# Patient Record
Sex: Male | Born: 1950 | Race: White | Hispanic: No | Marital: Married | Smoking: Never smoker
Health system: Southern US, Community
[De-identification: ages and names within clinical notes are randomized; demographics above are authoritative.]

## PROBLEM LIST (undated history)

## (undated) DIAGNOSIS — I10 Essential (primary) hypertension: Secondary | ICD-10-CM

## (undated) DIAGNOSIS — T4145XA Adverse effect of unspecified anesthetic, initial encounter: Secondary | ICD-10-CM

## (undated) DIAGNOSIS — K219 Gastro-esophageal reflux disease without esophagitis: Secondary | ICD-10-CM

## (undated) DIAGNOSIS — I341 Nonrheumatic mitral (valve) prolapse: Secondary | ICD-10-CM

## (undated) DIAGNOSIS — N189 Chronic kidney disease, unspecified: Secondary | ICD-10-CM

## (undated) DIAGNOSIS — T8859XA Other complications of anesthesia, initial encounter: Secondary | ICD-10-CM

## (undated) DIAGNOSIS — Z9889 Other specified postprocedural states: Secondary | ICD-10-CM

## (undated) DIAGNOSIS — M1990 Unspecified osteoarthritis, unspecified site: Secondary | ICD-10-CM

## (undated) DIAGNOSIS — Z87442 Personal history of urinary calculi: Secondary | ICD-10-CM

## (undated) DIAGNOSIS — R112 Nausea with vomiting, unspecified: Secondary | ICD-10-CM

## (undated) HISTORY — PX: MANDIBLE FRACTURE SURGERY: SHX706

## (undated) HISTORY — PX: OTHER SURGICAL HISTORY: SHX169

## (undated) HISTORY — PX: APPENDECTOMY: SHX54

---

## 2007-04-06 ENCOUNTER — Emergency Department (HOSPITAL_COMMUNITY): Admission: EM | Admit: 2007-04-06 | Discharge: 2007-04-06 | Payer: Self-pay | Admitting: Emergency Medicine

## 2007-06-23 ENCOUNTER — Emergency Department (HOSPITAL_COMMUNITY): Admission: EM | Admit: 2007-06-23 | Discharge: 2007-06-23 | Payer: Self-pay | Admitting: Family Medicine

## 2007-11-11 ENCOUNTER — Encounter (INDEPENDENT_AMBULATORY_CARE_PROVIDER_SITE_OTHER): Payer: Self-pay | Admitting: Gastroenterology

## 2007-11-11 ENCOUNTER — Ambulatory Visit (HOSPITAL_COMMUNITY): Admission: RE | Admit: 2007-11-11 | Discharge: 2007-11-11 | Payer: Self-pay | Admitting: Gastroenterology

## 2015-06-03 HISTORY — PX: CARPAL TUNNEL RELEASE: SHX101

## 2017-12-15 ENCOUNTER — Other Ambulatory Visit (HOSPITAL_COMMUNITY): Payer: Self-pay | Admitting: Neurological Surgery

## 2017-12-15 ENCOUNTER — Other Ambulatory Visit: Payer: Self-pay | Admitting: Neurological Surgery

## 2017-12-15 DIAGNOSIS — M48061 Spinal stenosis, lumbar region without neurogenic claudication: Secondary | ICD-10-CM

## 2017-12-29 NOTE — Progress Notes (Addendum)
PCP: Alyce Paganenise Bryant PA-C  Cardiologist: pt denies  EKG: pt denies past year, obtained today  Stress test:  2016 requested from Dr. Leverne Humblesomas Vybiral, MD  ECHO: 2016 requested from Dr. Leverne Humblesomas Vybiral, MD  Cardiac Cath: pt denies  Chest x-ray: pt denies past year

## 2017-12-29 NOTE — Pre-Procedure Instructions (Signed)
Brian Davenport  12/29/2017      [id number] - [place], [place] - [address] AT [facility] [address] [place] [address] Phone: [phone] Fax: [phone]    Your procedure is scheduled on January 05, 2018.  Report to [facility] Admitting at 945 AM.  Call this number if you have problems the morning of surgery:  [phone]   Remember:  Do not eat or drink after midnight.     Take these medicines the morning of surgery with A SIP OF WATER (none).  7 days prior to surgery STOP taking any Aspirin (unless otherwise instructed by your surgeon), Aleve, Naproxen, Ibuprofen, Motrin, Advil, Goody's, BC's, all herbal medications, fish oil, and all vitamins    Do not wear jewelry, make-up or nail polish.  Do not wear lotions, powders, or perfumes, or deodorant.  Men may shave face and neck.  Do not bring valuables to the hospital.  [facility] is not responsible for any belongings or valuables.  Contacts, dentures or bridgework may not be worn into surgery.  Leave your suitcase in the car.  After surgery it may be brought to your room.  For patients admitted to the hospital, discharge time will be determined by your treatment team.  Patients discharged the day of surgery will not be allowed to drive home.    [place]- Preparing For Surgery  Before surgery, you can play an important role. Because skin is not sterile, your skin needs to be as free of germs as possible. You can reduce the number of germs on your skin by washing with CHG (chlorahexidine gluconate) Soap before surgery.  CHG is an antiseptic cleaner which kills germs and bonds with the skin to continue killing germs even after washing.    Oral Hygiene is also important to reduce your risk of infection.  Remember - BRUSH YOUR TEETH THE MORNING OF SURGERY WITH YOUR REGULAR TOOTHPASTE  Please do not use if you have an allergy to CHG or antibacterial soaps. If your skin becomes  reddened/irritated stop using the CHG.  Do not shave (including legs and underarms) for at least 48 hours prior to first CHG shower. It is OK to shave your face.  Please follow these instructions carefully.   1. Shower the NIGHT BEFORE SURGERY and the MORNING OF SURGERY with CHG.   2. If you chose to wash your hair, wash your hair first as usual with your normal shampoo.  3. After you shampoo, rinse your hair and body thoroughly to remove the shampoo.  4. Use CHG as you would any other liquid soap. You can apply CHG directly to the skin and wash gently with a scrungie or a clean washcloth.   5. Apply the CHG Soap to your body ONLY FROM THE NECK DOWN.  Do not use on open wounds or open sores. Avoid contact with your eyes, ears, mouth and genitals (private parts). Wash Face and genitals (private parts)  with your normal soap.  6. Wash thoroughly, paying special attention to the area where your surgery will be performed.  7. Thoroughly rinse your body with warm water from the neck down.  8. DO NOT shower/wash with your normal soap after using and rinsing off the CHG Soap.  9. Pat yourself dry with a CLEAN TOWEL.  10. Wear CLEAN PAJAMAS to bed the night before surgery, wear comfortable clothes the morning of surgery  11. Place CLEAN  SHEETS on your bed the night of your first shower and DO NOT SLEEP WITH PETS.  Day of Surgery:  Do not apply any deodorants/lotions.  Please wear clean clothes to the hospital/surgery center.   Remember to brush your teeth WITH YOUR REGULAR TOOTHPASTE.  Please read over the following fact sheets that you were given. Pain Booklet, Coughing and Deep Breathing, MRSA Information and Surgical Site Infection Prevention

## 2017-12-30 ENCOUNTER — Encounter (HOSPITAL_COMMUNITY)
Admission: RE | Admit: 2017-12-30 | Discharge: 2017-12-30 | Disposition: A | Discharge status: Home / Self Care | Payer: Medicare PPO | Source: Ambulatory Visit | Attending: Neurological Surgery | Admitting: Neurological Surgery

## 2017-12-30 ENCOUNTER — Ambulatory Visit (HOSPITAL_COMMUNITY)
Admission: RE | Admit: 2017-12-30 | Discharge: 2017-12-30 | Disposition: A | Discharge status: Home / Self Care | Payer: Medicare PPO | Source: Ambulatory Visit | Attending: Neurological Surgery | Admitting: Neurological Surgery

## 2017-12-30 ENCOUNTER — Encounter (HOSPITAL_COMMUNITY): Payer: Self-pay

## 2017-12-30 ENCOUNTER — Other Ambulatory Visit: Payer: Self-pay

## 2017-12-30 DIAGNOSIS — M48061 Spinal stenosis, lumbar region without neurogenic claudication: Secondary | ICD-10-CM | POA: Diagnosis not present

## 2017-12-30 DIAGNOSIS — Z01810 Encounter for preprocedural cardiovascular examination: Secondary | ICD-10-CM | POA: Insufficient documentation

## 2017-12-30 DIAGNOSIS — M4316 Spondylolisthesis, lumbar region: Secondary | ICD-10-CM | POA: Diagnosis not present

## 2017-12-30 HISTORY — DX: Gastro-esophageal reflux disease without esophagitis: K21.9

## 2017-12-30 HISTORY — DX: Essential (primary) hypertension: I10

## 2017-12-30 HISTORY — DX: Nausea with vomiting, unspecified: R11.2

## 2017-12-30 HISTORY — DX: Nonrheumatic mitral (valve) prolapse: I34.1

## 2017-12-30 HISTORY — DX: Personal history of urinary calculi: Z87.442

## 2017-12-30 HISTORY — DX: Adverse effect of unspecified anesthetic, initial encounter: T41.45XA

## 2017-12-30 HISTORY — DX: Unspecified osteoarthritis, unspecified site: M19.90

## 2017-12-30 HISTORY — DX: Other specified postprocedural states: Z98.890

## 2017-12-30 HISTORY — DX: Other complications of anesthesia, initial encounter: T88.59XA

## 2017-12-30 HISTORY — DX: Chronic kidney disease, unspecified: N18.9

## 2017-12-30 LAB — CBC
HCT: 44.4 % (ref 39.0–52.0)
HEMOGLOBIN: 14.8 g/dL (ref 13.0–17.0)
MCH: 32.9 pg (ref 26.0–34.0)
MCHC: 33.3 g/dL (ref 30.0–36.0)
MCV: 98.7 fL (ref 78.0–100.0)
PLATELETS: 147 10*3/uL — AB (ref 150–400)
RBC: 4.5 MIL/uL (ref 4.22–5.81)
RDW: 12.8 % (ref 11.5–15.5)
WBC: 6.9 10*3/uL (ref 4.0–10.5)

## 2017-12-30 LAB — SURGICAL PCR SCREEN
MRSA, PCR: NEGATIVE
STAPHYLOCOCCUS AUREUS: NEGATIVE

## 2017-12-30 LAB — TYPE AND SCREEN
ABO/RH(D): A POS
ANTIBODY SCREEN: NEGATIVE

## 2017-12-30 LAB — ABO/RH: ABO/RH(D): A POS

## 2017-12-30 LAB — BASIC METABOLIC PANEL
Anion gap: 9 (ref 5–15)
BUN: 32 mg/dL — ABNORMAL HIGH (ref 8–23)
CALCIUM: 9.6 mg/dL (ref 8.9–10.3)
CHLORIDE: 105 mmol/L (ref 98–111)
CO2: 25 mmol/L (ref 22–32)
CREATININE: 1.94 mg/dL — AB (ref 0.61–1.24)
GFR calc non Af Amer: 34 mL/min — ABNORMAL LOW (ref >60)
GFR, EST AFRICAN AMERICAN: 39 mL/min — AB (ref >60)
Glucose, Bld: 99 mg/dL (ref 70–99)
Potassium: 4.5 mmol/L (ref 3.5–5.1)
SODIUM: 139 mmol/L (ref 135–145)

## 2017-12-31 NOTE — Progress Notes (Signed)
Anesthesia Chart Review:  Case:  [id number] Date/Time:  01/05/18 1132   Procedures:      Lumbar 1-2, Lumbar 2-3, Lumbar 3-4, Lumbar 4-5 Anterolateral decompression/fusion/posterior percutaneous arthrodesis/infuse/mazor (N/A ) - Lumbar 1-2, Lumbar 2-3, Lumbar 3-4, Lumbar 4-5 Anterolateral decompression/fusion/posterior percutaneous arthrodesis/infuse/mazor     APPLICATION OF ROBOTIC ASSISTANCE FOR SPINAL PROCEDURE (N/A )   Anesthesia type:  General   Pre-op diagnosis:  Degenerative Lumbar Spinal stenosis   Location:  [facility] OR ROOM 21 / [facility] OR   Surgeon:  Barnett AbuElsner, Henry, MD      DISCUSSION: 67 yo male never smoker for above procedure. 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  Pt is followed by Nephrology Dr. Merrilee Jans[facility]Gregory Greenwood [facility] for ADPKD with stable elevated creatinine. Review of labs in Care Everywhere shows creatinine range 1.6-1.9 over the past 2 years.  He was previously [facility] by at [facility] by Dr. Leverne Humblesomas Vybiral for evaluation of chronic DOE, fatigue, weakness. He had an Echo with normal LV size and function, EF 71%. He had a treadmill stress test with no ischemic abnormalities.  Anticipate he can proceed with surgery as scheduled barring acute status change.  VS: BP (!) 147/66   Pulse 73   Temp 36.5 C   Resp 20   Ht 5\' 11"  (1.803 m)   Wt 167 lb 12.8 oz (76.1 kg)   SpO2 96%   BMI 23.40 kg/m   PROVIDERS: Carolynn ServeBryant, Denise A, MD is PCP  Frazier ButtVybiral, Thomas, MD is Cardiologist  LABS: Labs reviewed: Acceptable for surgery. Elevated Cr in setting of ADPKD, consistent with previous values. (all labs ordered are listed, but only abnormal results are displayed)  Labs Reviewed  BASIC METABOLIC PANEL - Abnormal; Notable for the following components:      Result Value   BUN 32 (*)    Creatinine, Ser 1.94 (*)    GFR calc non Af Amer 34 (*)    GFR calc Af Amer 39 (*)    All other components within normal limits  CBC - Abnormal; Notable  for the following components:   Platelets 147 (*)    All other components within normal limits  SURGICAL PCR SCREEN  TYPE AND SCREEN  ABO/RH     IMAGES: N/A   EKG: 12/30/2017: Sinus bradycardia (58)  CV: Exercise stress test August 02, 2015 (outside records see copy in patient chart): Mr. Eugenia Pancoastrimble underwent regular Bruce protocol cardiac stress test.  The patient was able to exercise for 3 minutes, achieving 4.6 METS.  The test was accurate.  The patient was transferred.  EKG showed no ischemic ST segment abnormalities.  It did show a short episode of paroxysmal supraventricular tachycardia.  I will call the test subjectively and objectively negative.  ECHO September 13, 2015 (outside record, see copy in patient chart): The left ventricle is normal in size and function. The left ventricular ejection fraction is estimated at 71%. Left ventricular function is in the upper limit of normal. There is mild concentric left ventricular hypertrophy. The sinus of Valsalva, aortic root and ascending aorta are not dilated. The right ventricle is normal in size and function. Left atrium is normal in size. The right atrium is normal in size. Mitral leaflets are thickened. Mitral valve prolapse is present. Aortic valvular sclerosis is present. The tricuspid valve is pliable. The pulmonic valve is not visualized. There is no pericardial effusion. There are no valvular vegetations or intracavitary thrombi. Summary: Normal left ventricular  size and systolic function. Left ventricular hypertrophy and diastolic dysfunction. Mild mitral valve prolapse mild mitral regurgitation.  Past Medical History:  Diagnosis Date  . Arthritis   . Chronic kidney disease    polycystic kidney disease  . Complication of anesthesia   . GERD (gastroesophageal reflux disease)   . History of kidney stones   . Hypertension   . Mitral valve prolapse   . PONV (postoperative nausea and vomiting)     Past Surgical  History:  Procedure Laterality Date  . APPENDECTOMY    . CARPAL TUNNEL RELEASE Bilateral 2017  . MANDIBLE FRACTURE SURGERY    . sinus sugery      MEDICATIONS: . Ascorbic Acid (VITAMIN C) 1000 MG tablet  . Cholecalciferol (VITAMIN D) 2000 units tablet  . Magnesium Oxide (MAG-200) 200 MG TABS   No current facility-administered medications for this encounter.     Zannie Cove [facility] Short Stay Center/Anesthesiology Phone [phone] 12/31/2017 4:53 PM

## 2018-01-05 ENCOUNTER — Other Ambulatory Visit: Payer: Self-pay

## 2018-01-05 ENCOUNTER — Encounter (HOSPITAL_COMMUNITY)
Admission: RE | Disposition: A | Discharge status: Home / Self Care | Payer: Self-pay | Source: Home / Self Care | Attending: Neurological Surgery

## 2018-01-05 ENCOUNTER — Inpatient Hospital Stay (HOSPITAL_COMMUNITY): Payer: Medicare PPO | Admitting: Anesthesiology

## 2018-01-05 ENCOUNTER — Inpatient Hospital Stay (HOSPITAL_COMMUNITY): Payer: Medicare PPO | Admitting: Physician Assistant

## 2018-01-05 ENCOUNTER — Encounter (HOSPITAL_COMMUNITY): Payer: Self-pay | Admitting: *Deleted

## 2018-01-05 ENCOUNTER — Inpatient Hospital Stay (HOSPITAL_COMMUNITY)
Admission: RE | Admit: 2018-01-05 | Discharge: 2018-01-09 | DRG: 457 | Disposition: A | Discharge status: Home / Self Care | Payer: Medicare PPO | Attending: Neurological Surgery | Admitting: Neurological Surgery

## 2018-01-05 ENCOUNTER — Inpatient Hospital Stay (HOSPITAL_COMMUNITY): Payer: Medicare PPO

## 2018-01-05 DIAGNOSIS — K5900 Constipation, unspecified: Secondary | ICD-10-CM | POA: Diagnosis not present

## 2018-01-05 DIAGNOSIS — M419 Scoliosis, unspecified: Secondary | ICD-10-CM | POA: Diagnosis present

## 2018-01-05 DIAGNOSIS — M48062 Spinal stenosis, lumbar region with neurogenic claudication: Principal | ICD-10-CM | POA: Diagnosis present

## 2018-01-05 DIAGNOSIS — M4150 Other secondary scoliosis, site unspecified: Secondary | ICD-10-CM | POA: Diagnosis present

## 2018-01-05 DIAGNOSIS — Z419 Encounter for procedure for purposes other than remedying health state, unspecified: Secondary | ICD-10-CM

## 2018-01-05 DIAGNOSIS — M5416 Radiculopathy, lumbar region: Secondary | ICD-10-CM | POA: Diagnosis present

## 2018-01-05 DIAGNOSIS — M5126 Other intervertebral disc displacement, lumbar region: Secondary | ICD-10-CM | POA: Diagnosis present

## 2018-01-05 DIAGNOSIS — Q613 Polycystic kidney, unspecified: Secondary | ICD-10-CM | POA: Diagnosis not present

## 2018-01-05 DIAGNOSIS — Z885 Allergy status to narcotic agent status: Secondary | ICD-10-CM | POA: Diagnosis not present

## 2018-01-05 DIAGNOSIS — M549 Dorsalgia, unspecified: Secondary | ICD-10-CM | POA: Diagnosis present

## 2018-01-05 HISTORY — PX: ANTERIOR LATERAL LUMBAR FUSION 4 LEVELS: SHX5552

## 2018-01-05 HISTORY — PX: APPLICATION OF ROBOTIC ASSISTANCE FOR SPINAL PROCEDURE: SHX6753

## 2018-01-05 HISTORY — PX: LUMBAR PERCUTANEOUS PEDICLE SCREW 3 LEVEL: SHX5562

## 2018-01-05 SURGERY — ANTERIOR LATERAL LUMBAR FUSION 4 LEVELS
Anesthesia: General | Site: Spine Lumbar

## 2018-01-05 MED ORDER — POLYETHYLENE GLYCOL 3350 17 G PO PACK
17.0000 g | PACK | Freq: Every day | ORAL | Status: DC | PRN
  Administered 2018-01-06 – 2018-01-08 (×2): 17 g via ORAL
  Filled 2018-01-05 (×2): qty 1

## 2018-01-05 MED ORDER — CHLORHEXIDINE GLUCONATE CLOTH 2 % EX PADS: 6.0000 | MEDICATED_PAD | Freq: Once | CUTANEOUS | Status: DC

## 2018-01-05 MED ORDER — DEXAMETHASONE SODIUM PHOSPHATE 10 MG/ML IJ SOLN
INTRAMUSCULAR | Status: AC
  Filled 2018-01-05: qty 1

## 2018-01-05 MED ORDER — SCOPOLAMINE 1 MG/3DAYS TD PT72
MEDICATED_PATCH | TRANSDERMAL | Status: DC | PRN
  Administered 2018-01-05: 1 via TRANSDERMAL

## 2018-01-05 MED ORDER — LIDOCAINE HCL (CARDIAC) PF 100 MG/5ML IV SOSY
PREFILLED_SYRINGE | INTRAVENOUS | Status: DC | PRN
  Administered 2018-01-05: 100 mg via INTRATRACHEAL

## 2018-01-05 MED ORDER — HYDROMORPHONE HCL 1 MG/ML IJ SOLN
0.2500 mg | INTRAMUSCULAR | Status: DC | PRN
  Administered 2018-01-05 (×2): 1 mg via INTRAVENOUS

## 2018-01-05 MED ORDER — MIDAZOLAM HCL 2 MG/2ML IJ SOLN
INTRAMUSCULAR | Status: AC
Start: 2018-01-05 — End: ?
  Filled 2018-01-05: qty 2

## 2018-01-05 MED ORDER — CEFAZOLIN SODIUM-DEXTROSE 2-4 GM/100ML-% IV SOLN
INTRAVENOUS | Status: AC
  Filled 2018-01-05: qty 100

## 2018-01-05 MED ORDER — HYDROMORPHONE HCL 1 MG/ML IJ SOLN
INTRAMUSCULAR | Status: AC
  Filled 2018-01-05: qty 1

## 2018-01-05 MED ORDER — LACTATED RINGERS IV SOLN
INTRAVENOUS | Status: DC | PRN
  Administered 2018-01-05 (×3): via INTRAVENOUS

## 2018-01-05 MED ORDER — METHOCARBAMOL 1000 MG/10ML IJ SOLN
500.0000 mg | Freq: Four times a day (QID) | INTRAVENOUS | Status: DC | PRN
  Administered 2018-01-05: 500 mg via INTRAVENOUS
  Filled 2018-01-05 (×2): qty 5

## 2018-01-05 MED ORDER — ROCURONIUM BROMIDE 10 MG/ML (PF) SYRINGE
PREFILLED_SYRINGE | INTRAVENOUS | Status: AC
  Filled 2018-01-05: qty 10

## 2018-01-05 MED ORDER — MIDAZOLAM HCL 2 MG/2ML IJ SOLN
INTRAMUSCULAR | Status: AC
  Filled 2018-01-05: qty 2

## 2018-01-05 MED ORDER — LIDOCAINE 2% (20 MG/ML) 5 ML SYRINGE
INTRAMUSCULAR | Status: AC
  Filled 2018-01-05: qty 5

## 2018-01-05 MED ORDER — DIPHENHYDRAMINE HCL 50 MG/ML IJ SOLN
12.5000 mg | Freq: Four times a day (QID) | INTRAMUSCULAR | Status: DC | PRN
  Administered 2018-01-06: 12.5 mg via INTRAVENOUS
  Filled 2018-01-05: qty 1

## 2018-01-05 MED ORDER — SUCCINYLCHOLINE CHLORIDE 200 MG/10ML IV SOSY
PREFILLED_SYRINGE | INTRAVENOUS | Status: AC
  Filled 2018-01-05: qty 10

## 2018-01-05 MED ORDER — NALOXONE HCL 0.4 MG/ML IJ SOLN: 0.4000 mg | INTRAMUSCULAR | Status: DC | PRN

## 2018-01-05 MED ORDER — SCOPOLAMINE 1 MG/3DAYS TD PT72
MEDICATED_PATCH | TRANSDERMAL | Status: AC
  Filled 2018-01-05: qty 1

## 2018-01-05 MED ORDER — MIDAZOLAM HCL 5 MG/5ML IJ SOLN
INTRAMUSCULAR | Status: DC | PRN
  Administered 2018-01-05 (×2): 2 mg via INTRAVENOUS

## 2018-01-05 MED ORDER — FENTANYL CITRATE (PF) 250 MCG/5ML IJ SOLN
INTRAMUSCULAR | Status: AC
  Filled 2018-01-05: qty 10

## 2018-01-05 MED ORDER — SODIUM CHLORIDE 0.9 % IV SOLN
INTRAVENOUS | Status: DC | PRN
  Administered 2018-01-05: 14:00:00

## 2018-01-05 MED ORDER — HYDROMORPHONE HCL 1 MG/ML IJ SOLN
INTRAMUSCULAR | Status: AC
  Administered 2018-01-05: 1 mg
  Filled 2018-01-05: qty 1

## 2018-01-05 MED ORDER — DIPHENHYDRAMINE HCL 12.5 MG/5ML PO ELIX: 12.5000 mg | ORAL_SOLUTION | Freq: Four times a day (QID) | ORAL | Status: DC | PRN

## 2018-01-05 MED ORDER — BISACODYL 10 MG RE SUPP: 10.0000 mg | Freq: Every day | RECTAL | Status: DC | PRN

## 2018-01-05 MED ORDER — OXYCODONE-ACETAMINOPHEN 5-325 MG PO TABS: 1.0000 | ORAL_TABLET | ORAL | Status: DC | PRN

## 2018-01-05 MED ORDER — PHENYLEPHRINE 40 MCG/ML (10ML) SYRINGE FOR IV PUSH (FOR BLOOD PRESSURE SUPPORT)
PREFILLED_SYRINGE | INTRAVENOUS | Status: AC
  Filled 2018-01-05: qty 10

## 2018-01-05 MED ORDER — MENTHOL 3 MG MT LOZG: 1.0000 | LOZENGE | OROMUCOSAL | Status: DC | PRN

## 2018-01-05 MED ORDER — LIDOCAINE-EPINEPHRINE 1 %-1:100000 IJ SOLN
INTRAMUSCULAR | Status: AC
  Filled 2018-01-05: qty 1

## 2018-01-05 MED ORDER — PROMETHAZINE HCL 25 MG/ML IJ SOLN
6.2500 mg | INTRAMUSCULAR | Status: DC | PRN
  Administered 2018-01-05: 12.5 mg via INTRAVENOUS

## 2018-01-05 MED ORDER — ONDANSETRON HCL 4 MG/2ML IJ SOLN
4.0000 mg | Freq: Four times a day (QID) | INTRAMUSCULAR | Status: DC | PRN
  Administered 2018-01-05 – 2018-01-06 (×2): 4 mg via INTRAVENOUS
  Filled 2018-01-05 (×2): qty 2

## 2018-01-05 MED ORDER — FENTANYL CITRATE (PF) 250 MCG/5ML IJ SOLN
INTRAMUSCULAR | Status: DC | PRN
  Administered 2018-01-05 (×2): 50 ug via INTRAVENOUS
  Administered 2018-01-05: 150 ug via INTRAVENOUS
  Administered 2018-01-05: 100 ug via INTRAVENOUS

## 2018-01-05 MED ORDER — PHENYLEPHRINE HCL 10 MG/ML IJ SOLN
INTRAMUSCULAR | Status: DC | PRN
  Administered 2018-01-05: 20 ug/min via INTRAVENOUS

## 2018-01-05 MED ORDER — ONDANSETRON HCL 4 MG/2ML IJ SOLN: 4.0000 mg | Freq: Four times a day (QID) | INTRAMUSCULAR | Status: DC | PRN

## 2018-01-05 MED ORDER — GLYCOPYRROLATE 0.2 MG/ML IJ SOLN
INTRAMUSCULAR | Status: DC | PRN
  Administered 2018-01-05: 0.2 mg via INTRAVENOUS

## 2018-01-05 MED ORDER — LACTATED RINGERS IV SOLN
INTRAVENOUS | Status: DC
  Administered 2018-01-05 – 2018-01-07 (×5): via INTRAVENOUS

## 2018-01-05 MED ORDER — DEXAMETHASONE SODIUM PHOSPHATE 10 MG/ML IJ SOLN
INTRAMUSCULAR | Status: DC | PRN
  Administered 2018-01-05: 10 mg via INTRAVENOUS

## 2018-01-05 MED ORDER — 0.9 % SODIUM CHLORIDE (POUR BTL) OPTIME
TOPICAL | Status: DC | PRN
  Administered 2018-01-05 (×2): 1000 mL

## 2018-01-05 MED ORDER — SENNA 8.6 MG PO TABS
1.0000 | ORAL_TABLET | Freq: Two times a day (BID) | ORAL | Status: DC
  Administered 2018-01-06 – 2018-01-09 (×7): 8.6 mg via ORAL
  Filled 2018-01-05 (×8): qty 1

## 2018-01-05 MED ORDER — SODIUM CHLORIDE 0.9 % IV SOLN: 250.0000 mL | INTRAVENOUS | Status: DC

## 2018-01-05 MED ORDER — SODIUM CHLORIDE 0.9% FLUSH: 3.0000 mL | INTRAVENOUS | Status: DC | PRN

## 2018-01-05 MED ORDER — ONDANSETRON HCL 4 MG/2ML IJ SOLN
INTRAMUSCULAR | Status: DC | PRN
  Administered 2018-01-05 (×2): 4 mg via INTRAVENOUS

## 2018-01-05 MED ORDER — PHENOL 1.4 % MT LIQD: 1.0000 | OROMUCOSAL | Status: DC | PRN

## 2018-01-05 MED ORDER — GLYCOPYRROLATE PF 0.2 MG/ML IJ SOSY
PREFILLED_SYRINGE | INTRAMUSCULAR | Status: AC
  Filled 2018-01-05: qty 1

## 2018-01-05 MED ORDER — PROPOFOL 10 MG/ML IV BOLUS
INTRAVENOUS | Status: AC
  Filled 2018-01-05: qty 20

## 2018-01-05 MED ORDER — METHOCARBAMOL 500 MG PO TABS
500.0000 mg | ORAL_TABLET | Freq: Four times a day (QID) | ORAL | Status: DC | PRN
  Administered 2018-01-06 – 2018-01-07 (×2): 500 mg via ORAL
  Filled 2018-01-05 (×3): qty 1

## 2018-01-05 MED ORDER — SUCCINYLCHOLINE CHLORIDE 20 MG/ML IJ SOLN
INTRAMUSCULAR | Status: DC | PRN
  Administered 2018-01-05: 80 mg via INTRAVENOUS

## 2018-01-05 MED ORDER — DIAZEPAM 5 MG PO TABS
5.0000 mg | ORAL_TABLET | Freq: Four times a day (QID) | ORAL | Status: DC | PRN
  Administered 2018-01-05 – 2018-01-09 (×8): 5 mg via ORAL
  Filled 2018-01-05 (×9): qty 1

## 2018-01-05 MED ORDER — ONDANSETRON HCL 4 MG PO TABS: 4.0000 mg | ORAL_TABLET | Freq: Four times a day (QID) | ORAL | Status: DC | PRN

## 2018-01-05 MED ORDER — BUPIVACAINE HCL (PF) 0.5 % IJ SOLN
INTRAMUSCULAR | Status: AC
  Filled 2018-01-05: qty 30

## 2018-01-05 MED ORDER — LIDOCAINE-EPINEPHRINE 1 %-1:100000 IJ SOLN
INTRAMUSCULAR | Status: DC | PRN
  Administered 2018-01-05: 10 mL
  Administered 2018-01-05: 8 mL

## 2018-01-05 MED ORDER — PHENYLEPHRINE HCL 10 MG/ML IJ SOLN
INTRAMUSCULAR | Status: DC | PRN
  Administered 2018-01-05 (×5): 60 ug via INTRAVENOUS
  Administered 2018-01-05: 40 ug via INTRAVENOUS

## 2018-01-05 MED ORDER — MEPERIDINE HCL 50 MG/ML IJ SOLN: 6.2500 mg | INTRAMUSCULAR | Status: DC | PRN

## 2018-01-05 MED ORDER — HEMOSTATIC AGENTS (NO CHARGE) OPTIME
TOPICAL | Status: DC | PRN
  Administered 2018-01-05 (×3): 1 via TOPICAL

## 2018-01-05 MED ORDER — FLEET ENEMA 7-19 GM/118ML RE ENEM: 1.0000 | ENEMA | Freq: Once | RECTAL | Status: DC | PRN

## 2018-01-05 MED ORDER — SODIUM CHLORIDE 0.9% FLUSH
3.0000 mL | Freq: Two times a day (BID) | INTRAVENOUS | Status: DC
  Administered 2018-01-07 – 2018-01-09 (×4): 3 mL via INTRAVENOUS

## 2018-01-05 MED ORDER — ACETAMINOPHEN 650 MG RE SUPP: 650.0000 mg | RECTAL | Status: DC | PRN

## 2018-01-05 MED ORDER — CEFAZOLIN SODIUM-DEXTROSE 2-4 GM/100ML-% IV SOLN
2.0000 g | INTRAVENOUS | Status: AC
  Administered 2018-01-05 (×2): 2 g via INTRAVENOUS
  Filled 2018-01-05: qty 100

## 2018-01-05 MED ORDER — PROMETHAZINE HCL 25 MG/ML IJ SOLN
INTRAMUSCULAR | Status: AC
  Filled 2018-01-05: qty 1

## 2018-01-05 MED ORDER — ALUM & MAG HYDROXIDE-SIMETH 200-200-20 MG/5ML PO SUSP: 30.0000 mL | Freq: Four times a day (QID) | ORAL | Status: DC | PRN

## 2018-01-05 MED ORDER — MORPHINE SULFATE (PF) 4 MG/ML IV SOLN: 4.0000 mg | INTRAVENOUS | Status: DC | PRN

## 2018-01-05 MED ORDER — PROPOFOL 10 MG/ML IV BOLUS
INTRAVENOUS | Status: DC | PRN
  Administered 2018-01-05: 160 mg via INTRAVENOUS
  Administered 2018-01-05: 40 mg via INTRAVENOUS

## 2018-01-05 MED ORDER — SODIUM CHLORIDE 0.9% FLUSH: 9.0000 mL | INTRAVENOUS | Status: DC | PRN

## 2018-01-05 MED ORDER — ACETAMINOPHEN 325 MG PO TABS
650.0000 mg | ORAL_TABLET | ORAL | Status: DC | PRN
  Administered 2018-01-05: 650 mg via ORAL
  Filled 2018-01-05: qty 2

## 2018-01-05 MED ORDER — FENTANYL 40 MCG/ML IV SOLN
INTRAVENOUS | Status: DC
  Administered 2018-01-05: 1000 ug via INTRAVENOUS
  Administered 2018-01-06: 09:00:00 via INTRAVENOUS
  Administered 2018-01-06: 135 ug via INTRAVENOUS
  Administered 2018-01-07: 175 ug via INTRAVENOUS
  Administered 2018-01-07: 120 ug via INTRAVENOUS
  Filled 2018-01-05 (×4): qty 25

## 2018-01-05 MED ORDER — BUPIVACAINE HCL (PF) 0.5 % IJ SOLN
INTRAMUSCULAR | Status: DC | PRN
  Administered 2018-01-05: 8 mL
  Administered 2018-01-05: 10 mL

## 2018-01-05 MED ORDER — ONDANSETRON HCL 4 MG/2ML IJ SOLN
INTRAMUSCULAR | Status: AC
  Filled 2018-01-05: qty 2

## 2018-01-05 MED ORDER — DOCUSATE SODIUM 100 MG PO CAPS
100.0000 mg | ORAL_CAPSULE | Freq: Two times a day (BID) | ORAL | Status: DC
  Administered 2018-01-06 – 2018-01-09 (×7): 100 mg via ORAL
  Filled 2018-01-05 (×7): qty 1

## 2018-01-05 MED ORDER — EPHEDRINE 5 MG/ML INJ
INTRAVENOUS | Status: AC
  Filled 2018-01-05: qty 10

## 2018-01-05 MED ORDER — SUFENTANIL CITRATE 250 MCG/5ML IV SOLN
0.2500 ug/kg/h | INTRAVENOUS | Status: AC
  Administered 2018-01-05: 0.5 ug/kg/h via INTRAVENOUS
  Filled 2018-01-05: qty 5

## 2018-01-05 SURGICAL SUPPLY — 87 items
ADH SKN CLS APL DERMABOND .7 (GAUZE/BANDAGES/DRESSINGS) ×4
BAG DECANTER FOR FLEXI CONT (MISCELLANEOUS) ×1 IMPLANT
BIT DRILL LONG 3.0X30 (BIT) ×1 IMPLANT
BIT DRILL LONG 3.0X30MM (BIT) ×1
BIT DRILL LONG 3X80 (BIT) IMPLANT
BIT DRILL LONG 3X80MM (BIT)
BIT DRILL LONG 4X80 (BIT) IMPLANT
BIT DRILL LONG 4X80MM (BIT)
BIT DRILL SHORT 3.0X30 (BIT) IMPLANT
BIT DRILL SHORT 3.0X30MM (BIT)
BIT DRILL SHORT 3X80 (BIT) IMPLANT
BIT DRILL SHORT 3X80MM (BIT)
BLADE CLIPPER SURG (BLADE) IMPLANT
BLADE SURG 11 STRL SS (BLADE) ×3 IMPLANT
BONE MATRIX OSTEOCEL PRO MED (Bone Implant) ×10 IMPLANT
CARTRIDGE OIL MAESTRO DRILL (MISCELLANEOUS) ×1 IMPLANT
CLIP NEUROVISION LG (CLIP) ×2 IMPLANT
CONT SPEC 4OZ CLIKSEAL STRL BL (MISCELLANEOUS) ×1 IMPLANT
COVER BACK TABLE 24X17X13 BIG (DRAPES) IMPLANT
COVER BACK TABLE 60X90IN (DRAPES) ×3 IMPLANT
DERMABOND ADVANCED (GAUZE/BANDAGES/DRESSINGS) ×8
DERMABOND ADVANCED .7 DNX12 (GAUZE/BANDAGES/DRESSINGS) ×3 IMPLANT
DIFFUSER DRILL AIR PNEUMATIC (MISCELLANEOUS) ×1 IMPLANT
DRAPE C-ARM 42X72 X-RAY (DRAPES) ×6 IMPLANT
DRAPE C-ARMOR (DRAPES) ×6 IMPLANT
DRAPE LAPAROTOMY 100X72X124 (DRAPES) ×6 IMPLANT
DRAPE POUCH INSTRU U-SHP 10X18 (DRAPES) ×1 IMPLANT
DRAPE SHEET LG 3/4 BI-LAMINATE (DRAPES) ×1 IMPLANT
DURAPREP 26ML APPLICATOR (WOUND CARE) ×6 IMPLANT
ELECT BLADE 4.0 EZ CLEAN MEGAD (MISCELLANEOUS)
ELECT REM PT RETURN 9FT ADLT (ELECTROSURGICAL) ×6
ELECTRODE BLDE 4.0 EZ CLN MEGD (MISCELLANEOUS) IMPLANT
ELECTRODE REM PT RTRN 9FT ADLT (ELECTROSURGICAL) ×2 IMPLANT
FLOSEAL 5ML (HEMOSTASIS) ×6 IMPLANT
GAUZE 4X4 16PLY RFD (DISPOSABLE) IMPLANT
GLOVE BIOGEL PI IND STRL 6.5 (GLOVE) IMPLANT
GLOVE BIOGEL PI IND STRL 8 (GLOVE) IMPLANT
GLOVE BIOGEL PI IND STRL 8.5 (GLOVE) ×2 IMPLANT
GLOVE BIOGEL PI INDICATOR 6.5 (GLOVE) ×2
GLOVE BIOGEL PI INDICATOR 8 (GLOVE) ×4
GLOVE BIOGEL PI INDICATOR 8.5 (GLOVE) ×4
GLOVE ECLIPSE 7.5 STRL STRAW (GLOVE) ×6 IMPLANT
GLOVE ECLIPSE 8.5 STRL (GLOVE) ×8 IMPLANT
GLOVE SURG SS PI 6.0 STRL IVOR (GLOVE) ×6 IMPLANT
GOWN STRL REUS W/ TWL LRG LVL3 (GOWN DISPOSABLE) IMPLANT
GOWN STRL REUS W/ TWL XL LVL3 (GOWN DISPOSABLE) ×2 IMPLANT
GOWN STRL REUS W/TWL 2XL LVL3 (GOWN DISPOSABLE) ×10 IMPLANT
GOWN STRL REUS W/TWL LRG LVL3 (GOWN DISPOSABLE) ×3
GOWN STRL REUS W/TWL XL LVL3 (GOWN DISPOSABLE) ×3
GUIDEWIRE NITINOL BEVEL TIP (WIRE) ×20 IMPLANT
KIT BASIN OR (CUSTOM PROCEDURE TRAY) ×6 IMPLANT
KIT DILATOR XLIF 5 (KITS) ×1 IMPLANT
KIT NDL NVM5 EMG ELECT (KITS) IMPLANT
KIT NEEDLE NVM5 EMG ELECT (KITS) ×2 IMPLANT
KIT NEEDLE NVM5 EMG ELECTRODE (KITS) ×1
KIT SPINE MAZOR X ROBO DISP (MISCELLANEOUS) ×5 IMPLANT
KIT SURGICAL ACCESS MAXCESS 4 (KITS) ×2 IMPLANT
KIT TURNOVER KIT B (KITS) ×3 IMPLANT
KIT XLIF (KITS) ×1
MARKER SKIN DUAL TIP RULER LAB (MISCELLANEOUS) ×3 IMPLANT
MODULUS XLW 12X22X50MM 10DEG (Spine Construct) ×2 IMPLANT
MODULUS XLW 12X22X55MM 10 (Spine Construct) ×4 IMPLANT
MODULUS XLW 12X22X60MM 10 (Spine Construct) ×2 IMPLANT
NDL HYPO 25X1 1.5 SAFETY (NEEDLE) ×2 IMPLANT
NEEDLE HYPO 25X1 1.5 SAFETY (NEEDLE) ×3 IMPLANT
NS IRRIG 1000ML POUR BTL (IV SOLUTION) ×6 IMPLANT
OIL CARTRIDGE MAESTRO DRILL (MISCELLANEOUS)
PACK LAMINECTOMY NEURO (CUSTOM PROCEDURE TRAY) ×6 IMPLANT
PAD ARMBOARD 7.5X6 YLW CONV (MISCELLANEOUS) ×15 IMPLANT
PIN HEAD 2.5X60MM (PIN) IMPLANT
ROD RELINE MAS TI 5.5X160 (Rod) ×4 IMPLANT
SCREW LOCK RELINE 5.5 TULIP (Screw) ×20 IMPLANT
SCREW MAS RELINE 6.5X45 POLY (Screw) ×8 IMPLANT
SCREW PA RELINE MAS 5.5X50 C2 (Screw) ×12 IMPLANT
SCREW SCHANZ 4MMX80 (MISCELLANEOUS) ×2 IMPLANT
SCREW SCHANZ SA 4.0MM (MISCELLANEOUS) IMPLANT
SPONGE LAP 4X18 RFD (DISPOSABLE) IMPLANT
SUT VIC AB 1 CT1 18XBRD ANBCTR (SUTURE) ×1 IMPLANT
SUT VIC AB 1 CT1 8-18 (SUTURE) ×3
SUT VIC AB 2-0 CP2 18 (SUTURE) ×10 IMPLANT
SUT VIC AB 3-0 SH 8-18 (SUTURE) ×14 IMPLANT
TOWEL GREEN STERILE (TOWEL DISPOSABLE) ×6 IMPLANT
TOWEL GREEN STERILE FF (TOWEL DISPOSABLE) ×6 IMPLANT
TRAY FOLEY MTR SLVR 14FR STAT (SET/KITS/TRAYS/PACK) ×2 IMPLANT
TRAY FOLEY MTR SLVR 16FR STAT (SET/KITS/TRAYS/PACK) ×2 IMPLANT
TUBE MAZOR SA REDUCTION (TUBING) ×3 IMPLANT
WATER STERILE IRR 1000ML POUR (IV SOLUTION) ×6 IMPLANT

## 2018-01-05 NOTE — Social Work (Signed)
CSW acknowledging consult for SNF placement, will follow for therapy recommendations, pt will need insurance authorization through [facility] if SNF recommended.   Doy HutchingIsabel H Graysyn Bache, LCSWA [facility] Clinical Social Work [phone]

## 2018-01-05 NOTE — Op Note (Signed)
Date of surgery: 01/05/2018 Preoperative diagnosis: Degenerative scoliosis with stenosis and lumbar radiculopathy L1-L2 3 L3-4 L4-5. Postoperative diagnosis: Same Procedure: Anterolateral decompression with ex lift technique using titanium spacers at L1-L2 3 L3-4 L4-5 arthrodesis with allograft.  Neural monitoring.  Robotically assisted placement of pedicle screws from L1-L5 with segmental fixation percutaneously. Surgeon: Barnett Abu First assistant: Maeola Harman, MD Anesthesia: General endotracheal Indications: Brian Davenport is a 67 year old individual whose had significant problems with back and leg pain.  He is developed a degenerative scoliosis from the levels of L1 to down to L4-5 with significant lateral stenosis on the left side at L3-4 and 4 5 and on the right side at L2-3 and 1 2.  He is advised regarding surgical stabilization with decompression done anterolaterally and indirectly.  Procedure: Patient was brought to the operating room supine on the stretcher.  After the smooth induction of general endotracheal anesthesia without muscle relaxation EMG monitors were placed on the lower extremities.  He was then turned into the left lateral decubitus position.  Orthogonal allergy was checked radiographically and the patient was taken position.  Then after prepping with alcohol the skin areas were marked for the lateral incisions for entry into L1-L2 3 L3-4 and L4-5.  The surgery was then started by approaching L4-5 making a lateral skin entry incision.  A posterior flank incision was then created to allow admission of the fingertip into the retroperitoneal space through the muscular layers.  Then by guiding a an initial dilator the disc space at L4-5 was identified.  A series of dilators was then passed over the initial disc space all the time doing neural monitoring to make sure that none of the nerve roots were entrapped or nearby.  A lateral retractor was then placed over the dilators and  secured to the operating table with a clamp.  With fluoroscopic imaging a shim was placed after making sure that no neural elements were near the placement of the shim posteriorly.  The disc space was then opened with a #15 blade and a combination of curettes rongeurs and Cobb elevators were used to open the lateral ligament on the opposite side and the lateral performance of a nearly complete discectomy at the L4-L5 level.  The interspace was then sized and after a series of dilators were used ultimately was decided that a 22 x 60 mm spacer measuring 12 mm in height with 10 degrees of lordosis would fit into this interspace.  It was packed with ostia cell and placed under fluoroscopic guidance.  This procedure was then repeated at L3-4 L2-3 and L1 to again using consistent neural monitoring at each level at L3-4 and L2 355 mm spacers were chosen each being 12 mm tall with 10 degrees of lordosis and at L1 to a 50 mm spacer was chosen 12 mm tall with 10 degrees of lordosis all had an AP dimension of 22 mm.  Once all the spacers were placed final radiographs were obtained retractors were removed and the wounds were closed with 2-0 Vicryl in the fascia and 3-0 Vicryl subcuticularly.  Patient was then turned prone.  With the patient placed prone on the [facility] table neuro monitoring was continued and after prepping the back and draping sterilely a Steinmann pin was placed into the posterior superior iliac crest on the left side to secure the robotic arm.  Once this was secured targets were chosen after obtaining registration radiographs in the AP and lateral projection.  Sequentially then after approving the  program for placement of screws and L1 L2-L3-L4 and L5 robotic arm was used to guide placement of K wires in L1 L2-L3-L4 and L5.  At L1-L2 and L3 5.5 x 50 mm screws were placed again using neural monitoring on the probes and the screws themselves.  At L4 and L5 6.5 x 45 mm screws were placed.  Once the screws were  placed 160 mm precontoured rods were used to connect the screws from L1 L5.  These were placed in a neutral construct and were tightened down securely.  Final radiographs were obtained in AP and lateral projection.  The lateral incisions were each of the individual screw placements were then closed with 2-0 Vicryl in interrupted fashion 3-0 Vicryl was used subcuticularly.  For the entirety of the procedure blood loss is estimated no more than 250 cc.  Patient tolerated procedure well is returned to recovery room in stable condition

## 2018-01-05 NOTE — Transfer of Care (Signed)
Immediate Anesthesia Transfer of Care Note  Patient: Brian Davenport  Procedure(s) Performed: Lumbar One-Two, Lumbar Two-Three, Lumbar Three-Four, Lumbar Four-Five Anterolateral decompression/fusion/posterior percutaneous arthrodesis/mazor (N/A Spine Lumbar) APPLICATION OF ROBOTIC ASSISTANCE FOR SPINAL PROCEDURE (N/A Spine Lumbar) LUMBAR PERCUTANEOUS PEDICLE SCREW 3 LEVEL (N/A Spine Lumbar)  Patient Location: PACU  Anesthesia Type:General  Level of Consciousness: drowsy, patient cooperative and responds to stimulation  Airway & Oxygen Therapy: Patient Spontanous Breathing and Patient connected to nasal cannula oxygen  Post-op Assessment: Report given to RN and Post -op Vital signs reviewed and stable  Post vital signs: Reviewed and stable  Last Vitals:  Vitals Value Taken Time  BP 122/65 01/05/2018  3:37 PM  Temp    Pulse 70 01/05/2018  3:43 PM  Resp 21 01/05/2018  3:43 PM  SpO2 100 % 01/05/2018  3:43 PM  Vitals shown include unvalidated device data.  Last Pain:  Vitals:   01/05/18 0559  TempSrc:   PainSc: 0-No pain         Complications: No apparent anesthesia complications

## 2018-01-05 NOTE — Anesthesia Procedure Notes (Addendum)
Procedure Name: Intubation Date/Time: 01/05/2018 8:00 AM Performed by: Mariea Clonts, CRNA Pre-anesthesia Checklist: Patient identified, Emergency Drugs available, Suction available and Patient being monitored Patient Re-evaluated:Patient Re-evaluated prior to induction Oxygen Delivery Method: Circle System Utilized Preoxygenation: Pre-oxygenation with 100% oxygen Induction Type: IV induction Ventilation: Mask ventilation without difficulty Laryngoscope Size: Mac and 4 Grade View: Grade I Tube type: Oral Tube size: 8.0 mm Number of attempts: 1 Airway Equipment and Method: Stylet and Oral airway Placement Confirmation: ETT inserted through vocal cords under direct vision,  positive ETCO2 and breath sounds checked- equal and bilateral Tube secured with: Tape Dental Injury: Teeth and Oropharynx as per pre-operative assessment

## 2018-01-05 NOTE — Progress Notes (Signed)
rRx called patient is listed as allergic to codeine and morphine but they are prescribed for prn pain after surgery.   Rn called neuro office to ask about those medications being discharged and changed.   Also wife is stating that anesthesiologist told her "patient will be on a fentanyl drip on the  floor afterwards".

## 2018-01-05 NOTE — Anesthesia Procedure Notes (Signed)
Arterial Line Insertion Start/End8/10/2017 8:15 AM, 01/05/2018 8:18 AM Performed by: CRNA  Patient location: OR. Preanesthetic checklist: patient identified, IV checked, site marked, risks and benefits discussed, surgical consent, monitors and equipment checked, pre-op evaluation, timeout performed and anesthesia consent Left, radial was placed Catheter size: 20 G Hand hygiene performed  and maximum sterile barriers used   Attempts: 1 Procedure performed without using ultrasound guided technique. Following insertion, dressing applied and Biopatch. Post procedure assessment: normal

## 2018-01-05 NOTE — H&P (Signed)
CHIEF COMPLAINT: Back pain with significant leg discomfort, particularly when slightly flexed.  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  He notes that, over the past several years, he has developed increasing difficulties with back pain.  He notes that, as long as he is mobile and active, he does fairly well, but if he has to stand and, particularly, if he has to stand with a forward flex, he has substantial pain that develops. When he first became aware of this, he started in a program of exercise including physical therapy with some stretching and postural toning and this did help mitigate the symptoms considerably, but despite the passage of time, he notes that the symptoms seem to be increasingly recurring and increasingly more severe.  He notes that he can walk reasonably comfortably on a hike for about 20-30 minutes, but beyond that, he feels significantly impaired with his back.  He had films to evaluate this process and has noted that he has a degenerative scoliosis that has evolved with the levels involved being L1-2 at the superior aspect where he has a corrective curve and at L4-L5 where he has right-sided collapse of the disc space that starts the scoliotic curve. On his MRI, he has a profound stenosis of the central canal at L2-3 and at L3-4, and at L4-5, he has a right lateral recess stenosis primarily. At L1-2, he has a broad-based protrusion of the disc with left-sided foraminal stenosis.  His motor strength, he feels, has generally been intact, although he notes that he has some sensation of weakness more in the right leg and he has lost his reflexes on that side.  PAST MEDICAL HISTORY: Reveals that his general health has been good, save for a history of polycystic kidney disease.  He notes his BUN runs about 30, creatinine runs about 1.5.  ALLERGIES: He notes an intolerance  to most opioids, which cause severe constipation and nausea.  He notes that he could tolerate some Dilaudid that was given for a kidney stone some years ago.  PAST SURGICAL HISTORY: Other surgeries include mandible surgery in 1984, carpal tunnel releases bilaterally, and appendectomy in 2016.  PHYSICAL EXAMINATION: I note that he stands straight and erect.  He walks without antalgia.  Has good strength in the major groups including the iliopsoas, the quads, tibialis anterior, and gastrocs.  He has 2+ reflex in the left patella. Absent reflexes in both Achilles and he has a trace reflex in that right patella.  Straight leg raising is negative to 80 degrees and Patrick maneuver is negative bilaterally.  IMPRESSION: Mr. Brian Davenport has evidence of a degenerative scoliosis with severe stenosis at L2-3 and L3-4.  He has a curvature that starts at L4-5 and has a compensatory curve at L1-2.  I have advised that, ultimately, he would require a 4-level decompression.  I would do this via a lateral approach and placing  Xlift spacers at L1-2, L2-3, L3-4 and L4-5 to provide an indirect decompression of the spinal canal.  Then, he would be turned prone to undergo pedicular fixation at L1-L5 using robotic assistance.  Ultimately, I believe that he would experience good relief of the discomfort that he experiences in his back, although there is a trade off some stiffness in the back with a 4-level fusion.  We discussed whether we should leave L1-2 out and, given the nature of the anatomic process at L1-2,  I would be hard put to suggest that this would be a wise choice, as given the nature and degree of degeneration there, it is likely that he will progress this level over a few years after he has L2-L5 fused.  We did discuss that he had an opinion from Dr. Aquilla Hacker in [place] regarding the surgery and his suggestion was to do L2-L5.  I noted that, at the time of surgery, we would do an arthrodesis likely using some Infuse to  help induce good bony formation along with allograft, and posterior fixation generally is done through several small incisions on either side of the back with placement of a rod between the screws in a minimally invasive technique.  Generally, the hospitalization takes about 3 days, sometimes a day longer depending on how the patient is doing or recuperating. [place] he may want to spend an extra day in town to help accommodate his recovery and his capacity to travel.  He is admitted for surgery.

## 2018-01-05 NOTE — Progress Notes (Signed)
Transported to floor w/ 2 persons, restless, attempting to sit up during entire trip. Oreinted to self/place, follows command when asked to remain still

## 2018-01-05 NOTE — Anesthesia Preprocedure Evaluation (Signed)
Anesthesia Evaluation    Reviewed: Allergy & Precautions, Patient's Chart, lab work & pertinent test results  History of Anesthesia Complications (+) PONV and history of anesthetic complications  Airway Mallampati: II  TM Distance: >3 FB Neck ROM: Full    Dental no notable dental hx.    Pulmonary neg pulmonary ROS,    Pulmonary exam normal breath sounds clear to auscultation       Cardiovascular hypertension, Normal cardiovascular exam Rhythm:Regular Rate:Normal     Neuro/Psych negative neurological ROS     GI/Hepatic Neg liver ROS, GERD  ,  Endo/Other  negative endocrine ROS  Renal/GU Renal disease     Musculoskeletal negative musculoskeletal ROS (+) Arthritis ,   Abdominal   Peds  Hematology negative hematology ROS (+)   Anesthesia Other Findings   Reproductive/Obstetrics                             Anesthesia Physical Anesthesia Plan  ASA: II  Anesthesia Plan: General   Post-op Pain Management:    Induction: Intravenous  PONV Risk Score and Plan: 4 or greater and Ondansetron, Dexamethasone, Treatment may vary due to age or medical condition and Metaclopromide  Airway Management Planned: Oral ETT  Additional Equipment: Arterial line  Intra-op Plan:   Post-operative Plan: Extubation in OR  Informed Consent: I have reviewed the patients History and Physical, chart, labs and discussed the procedure including the risks, benefits and alternatives for the proposed anesthesia with the patient or authorized representative who has indicated his/her understanding and acceptance.   Dental advisory given  Plan Discussed with: CRNA  Anesthesia Plan Comments: (2x piv, sufenta gtt)        Anesthesia Quick Evaluation

## 2018-01-06 ENCOUNTER — Encounter (HOSPITAL_COMMUNITY): Payer: Self-pay | Admitting: Neurological Surgery

## 2018-01-06 LAB — BASIC METABOLIC PANEL
ANION GAP: 11 (ref 5–15)
BUN: 29 mg/dL — AB (ref 8–23)
CALCIUM: 8.4 mg/dL — AB (ref 8.9–10.3)
CO2: 23 mmol/L (ref 22–32)
Chloride: 102 mmol/L (ref 98–111)
Creatinine, Ser: 1.84 mg/dL — ABNORMAL HIGH (ref 0.61–1.24)
GFR calc Af Amer: 41 mL/min — ABNORMAL LOW (ref >60)
GFR calc non Af Amer: 36 mL/min — ABNORMAL LOW (ref >60)
Glucose, Bld: 183 mg/dL — ABNORMAL HIGH (ref 70–99)
POTASSIUM: 5.3 mmol/L — AB (ref 3.5–5.1)
SODIUM: 136 mmol/L (ref 135–145)

## 2018-01-06 LAB — CBC
HCT: 37.6 % — ABNORMAL LOW (ref 39.0–52.0)
Hemoglobin: 12.5 g/dL — ABNORMAL LOW (ref 13.0–17.0)
MCH: 32.6 pg (ref 26.0–34.0)
MCHC: 33.2 g/dL (ref 30.0–36.0)
MCV: 98.2 fL (ref 78.0–100.0)
Platelets: 123 10*3/uL — ABNORMAL LOW (ref 150–400)
RBC: 3.83 MIL/uL — AB (ref 4.22–5.81)
RDW: 12.6 % (ref 11.5–15.5)
WBC: 10.6 10*3/uL — AB (ref 4.0–10.5)

## 2018-01-06 MED ORDER — PROMETHAZINE HCL 25 MG PO TABS
25.0000 mg | ORAL_TABLET | ORAL | Status: DC | PRN
Start: 2018-01-06 — End: 2018-01-09

## 2018-01-06 MED ORDER — PROMETHAZINE HCL 25 MG/ML IJ SOLN
12.5000 mg | INTRAMUSCULAR | Status: DC | PRN
Start: 2018-01-06 — End: 2018-01-09
  Administered 2018-01-06: 12.5 mg via INTRAVENOUS
  Filled 2018-01-06: qty 1

## 2018-01-06 NOTE — Progress Notes (Signed)
Wasted 0.5 mcg of fentanyl in sink with Dahlia ClientHannah, RCharity fundraiser

## 2018-01-06 NOTE — Anesthesia Postprocedure Evaluation (Signed)
Anesthesia Post Note  Patient: Brian Davenport  Procedure(s) Performed: Lumbar One-Two, Lumbar Two-Three, Lumbar Three-Four, Lumbar Four-Five Anterolateral decompression/fusion/posterior percutaneous arthrodesis/mazor (N/A Spine Lumbar) APPLICATION OF ROBOTIC ASSISTANCE FOR SPINAL PROCEDURE (N/A Spine Lumbar) LUMBAR PERCUTANEOUS PEDICLE SCREW 3 LEVEL (N/A Spine Lumbar)     Patient location during evaluation: PACU Anesthesia Type: General Level of consciousness: sedated and patient cooperative Pain management: pain level controlled Vital Signs Assessment: post-procedure vital signs reviewed and stable Respiratory status: spontaneous breathing Cardiovascular status: stable Anesthetic complications: no    Last Vitals:  Vitals:   01/06/18 0400 01/06/18 0734  BP: (!) 148/72 119/78  Pulse:    Resp: 20   Temp: 36.8 C (!) 36.3 C  SpO2: 100%     Last Pain:  Vitals:   01/06/18 0734  TempSrc: Axillary  PainSc:                  Brian Davenport

## 2018-01-06 NOTE — Evaluation (Addendum)
Physical Therapy Evaluation Patient Details Name: Brian Davenport MRN: [id number] DOB: [birth year] Today's Date: 01/06/2018   History of Present Illness  67 yo male s/p anterior lateral lumbar fusion 4 levels. PMH includes polycystic kidney disease.  Clinical Impression  Arrived to pt in chair with wife present. Pt reported nausea at rest 8/10 with nurse aware. Pt willing to participate in physical therapy with limiting tolerance secondary to nausea. Pt deficits include back precautions, general weakness, unsteadiness on feet, decreased balance and functional mobility. Pt will benefit from skilled therapy to address above deficits to increase safety, functional mobility and independence, balance, and decrease fall risk.      Follow Up Recommendations Outpatient PT    Equipment Recommendations  None recommended by PT    Recommendations for Other Services       Precautions / Restrictions Precautions Precautions: Back Precaution Booklet Issued: Yes (comment) Precaution Comments: able to verbalize BLT precautions Required Braces or Orthoses: Spinal Brace Spinal Brace: Lumbar corset Restrictions Weight Bearing Restrictions: No      Mobility  Bed Mobility Overal bed mobility: Needs Assistance Bed Mobility: Sit to Supine;Rolling Rolling: Supervision Sidelying to sit: Min guard;HOB elevated(slightly elevated)   Sit to supine: Min assist   General bed mobility comments: verbal cue for hand placement and ear to pillow from sit to sidelying, reinforce sequencing after to go all the way down on side prior to rolling supine   Transfers Overall transfer level: Needs assistance Equipment used: Rolling walker (2 wheeled) Transfers: Sit to/from Stand Sit to Stand: Min guard Stand pivot transfers: Min guard;From elevated surface       General transfer comment: min guard for safety, pt reported feet feeling unsteady, good technique with back precautions    Ambulation/Gait Ambulation/Gait assistance: Min guard Gait Distance (Feet): 25 Feet Assistive device: Rolling walker (2 wheeled) Gait Pattern/deviations: Step-through pattern;Decreased stride length   Gait velocity interpretation: 1.31 - 2.62 ft/sec, indicative of limited community ambulator General Gait Details: distance limited due to nausea, pt felt could do more otherwise, slight unsteadiness with feet   Stairs            Wheelchair Mobility    Modified Rankin (Stroke Patients Only)       Balance Overall balance assessment: Needs assistance Sitting-balance support: No upper extremity supported Sitting balance-Leahy Scale: Good     Standing balance support: Bilateral upper extremity supported Standing balance-Leahy Scale: Fair Standing balance comment: pt able to stand without support, requested walker today for a little extra support                              Pertinent Vitals/Pain Pain Assessment: 0-10 Pain Score: 8  Pain Location: back Pain Descriptors / Indicators: Grimacing;Aching;Sore Pain Intervention(s): Monitored during session;Limited activity within patient's tolerance;Repositioned    Home Living Family/patient expects to be discharged to:: Private residence Living Arrangements: Spouse/significant other Available Help at Discharge: Family Type of Home: House Home Access: Stairs to enter Entrance Stairs-Rails: None Secretary/administrator of Steps: 2 Home Layout: One level Home Equipment: Shower seat;Grab bars - tub/shower Additional Comments: active at baseline    Prior Function Level of Independence: Independent               Hand Dominance        Extremity/Trunk Assessment   Upper Extremity Assessment Upper Extremity Assessment: Overall WFL for tasks assessed    Lower Extremity Assessment Lower Extremity Assessment: Overall [facility]  for tasks assessed    Cervical / Trunk Assessment Cervical / Trunk Assessment:  (s/p lumbar fusion)  Communication   Communication: No difficulties  Cognition Arousal/Alertness: Awake/alert Behavior During Therapy: WFL for tasks assessed/performed Overall Cognitive Status: Within Functional Limits for tasks assessed                                        General Comments General comments (skin integrity, edema, etc.): pt reported nausea, RN aware     Exercises     Assessment/Plan    PT Assessment Patient needs continued PT services  PT Problem List Decreased mobility;Decreased activity tolerance;Decreased balance       PT Treatment Interventions DME instruction;Therapeutic activities;Gait training;Therapeutic exercise;Patient/family education;Stair training;Balance training    PT Goals (Current goals can be found in the Care Plan section)  Acute Rehab PT Goals Patient Stated Goal: return home, be able to hike and garden  PT Goal Formulation: With patient/family Time For Goal Achievement: 01/20/18 Potential to Achieve Goals: Good    Frequency Min 5X/week   Barriers to discharge        Co-evaluation               AM-PAC PT "6 Clicks" Daily Activity  Outcome Measure Difficulty turning over in bed (including adjusting bedclothes, sheets and blankets)?: A Lot Difficulty moving from lying on back to sitting on the side of the bed? : A Little Difficulty sitting down on and standing up from a chair with arms (e.g., wheelchair, bedside commode, etc,.)?: A Little Help needed moving to and from a bed to chair (including a wheelchair)?: A Little Help needed walking in hospital room?: A Little Help needed climbing 3-5 steps with a railing? : A Little 6 Click Score: 17    End of Session Equipment Utilized During Treatment: Back brace Activity Tolerance: Patient tolerated treatment well;Other (comment)(limited by nausea ) Patient left: in bed;with nursing/sitter in room;with family/visitor present;with call bell/phone within  reach Nurse Communication: Mobility status PT Visit Diagnosis: Unsteadiness on feet (R26.81);Muscle weakness (generalized) (M62.81);Other abnormalities of gait and mobility (R26.89)    Time: [id number] PT Time Calculation (min) (ACUTE ONLY): 13 min   Charges:   PT Evaluation $PT Eval Moderate Complexity: 1 Mod          Carma LairKara Sharayah Renfrow, [place]PT  Acute Rehab [phone]   Carma LairKara Daney Moor 01/06/2018, 2:10 PM

## 2018-01-06 NOTE — Progress Notes (Signed)
Patient ID: Kerman PasseyMichael D Lenk, male   DOB: 07/04/1950, 67 y.o.   MRN: [id number] Vital signs are stable.  Patient tolerated fentanyl PCA well last night.  Unfortunately he has been without medication for 3 hours this morning and is rather uncomfortable.  Motor function appears good in the lower extremities.  Catheter is out and patient will need to be immobilized.  Discussed fentanyl issue with head nurse on unit.

## 2018-01-06 NOTE — Evaluation (Signed)
Occupational Therapy Evaluation Patient Details Name: Brian Davenport MRN: [id number] DOB: 02-Jan-1951 Today's Date: 01/06/2018    History of Present Illness 68 yo male s/p anterior lateral lumbar fusion 4 levels. PMH includes polycystic kidney disease.   Clinical Impression   Pt admitted with the above diagnoses and presents with below problem list. Pt will benefit from continued acute OT to address the below listed deficits and maximize independence with basic ADLs prior to dc home. PTA pt was independent with ADLs and active. Pt limited by 8/10 pain and some nausea this session. Pt is currently min guard with bed mobility, min A with LB ADLs. Pt completed bed mobility and stand-pivot transfer from EOB>recliner with min guard assist.     Follow Up Recommendations  Home health OT;Supervision/Assistance - 24 hour    Equipment Recommendations  3 in 1 bedside commode    Recommendations for Other Services PT consult     Precautions / Restrictions Precautions Precautions: Back Precaution Booklet Issued: Yes (comment) Precaution Comments: able to verbalize BLT precautions Required Braces or Orthoses: Spinal Brace Spinal Brace: Lumbar corset;Applied in sitting position Restrictions Weight Bearing Restrictions: No      Mobility Bed Mobility Overal bed mobility: Needs Assistance Bed Mobility: Rolling;Sidelying to Sit Rolling: Supervision Sidelying to sit: Min guard;HOB elevated(slightly elevated)       General bed mobility comments: good technique. min guard for safety  Transfers Overall transfer level: Needs assistance Equipment used: None Transfers: Sit to/from [employer] Transfers Sit to Stand: Min guard;From elevated surface Stand pivot transfers: Min guard;From elevated surface       General transfer comment: elevated bed height to match home setup. Cues for technique with back precautions    Balance Overall balance assessment: Mild deficits observed, not  formally tested                                         ADL either performed or assessed with clinical judgement   ADL Overall ADL's : Needs assistance/impaired Eating/Feeding: Set up;Sitting   Grooming: Min guard;Set up;Sitting;Standing   Upper Body Bathing: Set up;Sitting   Lower Body Bathing: Minimal assistance;Sit to/from stand Lower Body Bathing Details (indicate cue type and reason): difficulty raising feet to access in seated position  Upper Body Dressing : Set up   Lower Body Dressing: Minimal assistance;Sit to/from stand Lower Body Dressing Details (indicate cue type and reason): difficulty raising feet to access in seated position  Toilet Transfer: Min guard;Stand-pivot;RW   Toileting- Clothing Manipulation and Hygiene: Minimal assistance;Sit to/from stand;Cueing for compensatory techniques   Tub/ Shower Transfer: Min guard;Minimal assistance;3 in 1;Stand-pivot     General ADL Comments: Pt completed bed mobility and SPT EOB>recliner. difficulty raising feet to access in seated position.      Vision         Perception     Praxis      Pertinent Vitals/Pain Pain Assessment: 0-10 Pain Score: 8  Pain Location: back Pain Descriptors / Indicators: Grimacing;Aching;Sore Pain Intervention(s): Limited activity within patient's tolerance;Monitored during session;Repositioned;PCA encouraged     Hand Dominance     Extremity/Trunk Assessment Upper Extremity Assessment Upper Extremity Assessment: Overall WFL for tasks assessed   Lower Extremity Assessment Lower Extremity Assessment: Defer to PT evaluation   Cervical / Trunk Assessment Cervical / Trunk Assessment: (s/p lumbar fusion)   Communication Communication Communication: No difficulties   Cognition Arousal/Alertness: Awake/alert  Behavior During Therapy: WFL for tasks assessed/performed Overall Cognitive Status: Within Functional Limits for tasks assessed                                      General Comments  pt reporting nausea, declined request to RN for med    Exercises     Shoulder Instructions      Home Living Family/patient expects to be discharged to:: Private residence Living Arrangements: Spouse/significant other Available Help at Discharge: Family Type of Home: House Home Access: Stairs to enter Secretary/administratorntrance Stairs-Number of Steps: 2 Entrance Stairs-Rails: None Home Layout: One level     Bathroom Shower/Tub: Walk-in shower;Tub/shower unit   [employer] Waste IndustriesBathroom Toilet: Standard     Home Equipment: Shower seat;Grab bars - tub/shower   Additional Comments: active at baseline      Prior Functioning/Environment Level of Independence: Independent                 OT Problem List: Impaired balance (sitting and/or standing);Decreased knowledge of use of DME or AE;Decreased knowledge of precautions;Pain      OT Treatment/Interventions: Self-care/ADL training;DME and/or AE instruction;Therapeutic activities;Balance training;Patient/family education    OT Goals(Current goals can be found in the care plan section) Acute Rehab OT Goals Patient Stated Goal: feel better, go home OT Goal Formulation: With patient Time For Goal Achievement: 01/13/18 Potential to Achieve Goals: Good ADL Goals Pt Will Perform Lower Body Bathing: with modified independence;with adaptive equipment;sit to/from stand Pt Will Perform Lower Body Dressing: with modified independence;sit to/from stand;with adaptive equipment Pt Will Transfer to Toilet: ambulating;with supervision Pt Will Perform Toileting - Clothing Manipulation and hygiene: with min guard assist;with adaptive equipment;sit to/from stand Pt Will Perform Tub/Shower Transfer: with supervision;ambulating;shower seat  OT Frequency: Min 2X/week   Barriers to D/C:            Co-evaluation              AM-PAC PT "6 Clicks" Daily Activity     Outcome Measure Help from another person eating meals?:  None Help from another person taking care of personal grooming?: None Help from another person toileting, which includes using toliet, bedpan, or urinal?: A Little Help from another person bathing (including washing, rinsing, drying)?: A Little Help from another person to put on and taking off regular upper body clothing?: A Little Help from another person to put on and taking off regular lower body clothing?: A Little 6 Click Score: 20   End of Session Equipment Utilized During Treatment: Back brace Nurse Communication: Mobility status;Other (comment);Precautions(nausea)  Activity Tolerance: Patient limited by pain;Other (comment)(nausea) Patient left: in chair;with call bell/phone within reach  OT Visit Diagnosis: Unsteadiness on feet (R26.81);Pain                Time: 1043-1110 OT Time Calculation (min): 27 min Charges:  OT General Charges $OT Visit: 1 Visit OT Evaluation $OT Eval Low Complexity: 1 Low OT Treatments $Self Care/Home Management : 8-22 mins    Pilar GrammesMathews, Wanda Rideout H 01/06/2018, 11:40 AM

## 2018-01-06 NOTE — Progress Notes (Signed)
Fentanyl syringe replaced with Deanna ArtisKeisha RN. Approximately 0.5 mcg wasted from old syringe.

## 2018-01-06 NOTE — Progress Notes (Addendum)
MD office paged 1210 as patient asking for more nausea medications, verbal order for phenergan received 1220.   1330: after phenergan given 1248, pt became slightly confused, became cold and shivered. Vitals stable.  Patient due to void at this time after foley removal; bladder scan revealed 452 mL, I&O cath patient for . Patient continues to rest and is becoming less confused, will continue to monitor.

## 2018-01-06 NOTE — Progress Notes (Addendum)
[employer] called to order brace  

## 2018-01-07 MED ORDER — FENTANYL 25 MCG/HR TD PT72
50.0000 ug | MEDICATED_PATCH | TRANSDERMAL | Status: DC
  Administered 2018-01-07: 50 ug via TRANSDERMAL
  Filled 2018-01-07: qty 2

## 2018-01-07 MED ORDER — DEXAMETHASONE 2 MG PO TABS
2.0000 mg | ORAL_TABLET | Freq: Two times a day (BID) | ORAL | Status: DC
  Administered 2018-01-07 – 2018-01-09 (×5): 2 mg via ORAL
  Filled 2018-01-07 (×5): qty 1

## 2018-01-07 MED ORDER — TAPENTADOL HCL 50 MG PO TABS
50.0000 mg | ORAL_TABLET | ORAL | Status: DC | PRN
  Administered 2018-01-07 – 2018-01-09 (×8): 50 mg via ORAL
  Filled 2018-01-07 (×8): qty 1

## 2018-01-07 NOTE — Progress Notes (Signed)
OT Cancellation Note  Patient Details Name: Kerman PasseyMichael D Inglett MRN: [id number] DOB: [birth year]   Cancelled Treatment:    Reason Eval/Treat Not Completed: Other (comment). Pt just back in bed and would prefer to wait until later today or tomorrow for OT session.  Evette GeorgesLeonard, Stefano Trulson Eva [phone] 01/07/2018, 2:04 PM

## 2018-01-07 NOTE — Progress Notes (Signed)
Patient ID: Kerman PasseyMichael D Carew, male   DOB: 05/25/1951, 67 y.o.   MRN: [id number] Vital signs are stable Motor function appears good but Mr. Eugenia Pancoastrimble is experiencing more pain in the right iliopsoas and flank region today Dressings remain intact Mobilizing but nausea remains an issue He cannot tolerate Tylenol secondary to renal failure Also does not tolerate nonsteroidals well We will use Nucynta and fentanyl patch in addition to low-dose of Decadron to manage pain and nausea Progressing steadily

## 2018-01-07 NOTE — Progress Notes (Signed)
Physical Therapy Treatment Patient Details Name: Brian Davenport MRN: [id number] DOB: November 20, 1950 Today's Date: 01/07/2018    History of Present Illness 67 yo male s/p anterior lateral lumbar fusion 4 levels. PMH includes polycystic kidney disease.    PT Comments    Pt now feeling better and now showing the improvement expected.  Emphasis on education, transitions to sit and progressive ambulation.  Follow Up Recommendations  No PT follow up     Equipment Recommendations  None recommended by PT    Recommendations for Other Services       Precautions / Restrictions Precautions Precautions: Back Precaution Comments: able to verbalize BLT precautions Required Braces or Orthoses: Spinal Brace Spinal Brace: Lumbar corset Restrictions Weight Bearing Restrictions: No    Mobility  Bed Mobility Overal bed mobility: Needs Assistance Bed Mobility: Rolling;Sidelying to Sit Rolling: Supervision Sidelying to sit: Min guard       General bed mobility comments: cues for technique without use of the rail and to help him   Transfers Overall transfer level: Needs assistance Equipment used: Rolling walker (2 wheeled) Transfers: Sit to/from Stand Sit to Stand: Min guard         General transfer comment: cues for safe hand placement/transfer technique.  Ambulation/Gait Ambulation/Gait assistance: Min guard Gait Distance (Feet): 200 Feet Assistive device: Rolling walker (2 wheeled) Gait Pattern/deviations: Step-through pattern;Decreased stride length   Gait velocity interpretation: <1.8 ft/sec, indicate of risk for recurrent falls General Gait Details: gait tentative, but steady with cues for postural checks.  Slower cadence.   Stairs             Wheelchair Mobility    Modified Rankin (Stroke Patients Only)       Balance Overall balance assessment: Needs assistance Sitting-balance support: No upper extremity supported Sitting balance-Leahy Scale:  Good Sitting balance - Comments: able to donn brace at EOB   Standing balance support: Bilateral upper extremity supported Standing balance-Leahy Scale: Fair                              Cognition Arousal/Alertness: Awake/alert Behavior During Therapy: WFL for tasks assessed/performed Overall Cognitive Status: Within Functional Limits for tasks assessed                                        Exercises      General Comments General comments (skin integrity, edema, etc.): Reinforced all back care/prec, log roll/transition to/from sitting, donning the brace, lifting restrictions and progression of activity.      Pertinent Vitals/Pain Pain Assessment: Faces Faces Pain Scale: Hurts little more Pain Location: back Pain Descriptors / Indicators: Grimacing;Aching;Sore Pain Intervention(s): Monitored during session    Home Living                      Prior Function            PT Goals (current goals can now be found in the care plan section) Acute Rehab PT Goals Patient Stated Goal: return home, be able to hike and garden  PT Goal Formulation: With patient/family Time For Goal Achievement: 01/20/18 Potential to Achieve Goals: Good Progress towards PT goals: Progressing toward goals    Frequency    Min 5X/week      PT Plan Current plan remains appropriate    Co-evaluation  AM-PAC PT "6 Clicks" Daily Activity  Outcome Measure  Difficulty turning over in bed (including adjusting bedclothes, sheets and blankets)?: A Little Difficulty moving from lying on back to sitting on the side of the bed? : A Little Difficulty sitting down on and standing up from a chair with arms (e.g., wheelchair, bedside commode, etc,.)?: A Little Help needed moving to and from a bed to chair (including a wheelchair)?: A Little Help needed walking in hospital room?: A Little Help needed climbing 3-5 steps with a railing? : A Little 6  Click Score: 18    End of Session Equipment Utilized During Treatment: Back brace Activity Tolerance: Patient tolerated treatment well;Other (comment) Patient left: with call bell/phone within reach;in chair Nurse Communication: Mobility status PT Visit Diagnosis: Unsteadiness on feet (R26.81);Muscle weakness (generalized) (M62.81);Other abnormalities of gait and mobility (R26.89)     Time: 1235-1250 PT Time Calculation (min) (ACUTE ONLY): 15 min  Charges:  $Gait Training: 8-22 mins                     01/07/2018  [place] BingKen Donia Yokum, PT [phone]  (pager)   Eliseo GumKenneth V Hudsyn Champine 01/07/2018, 5:21 PM

## 2018-01-08 MED ORDER — FENTANYL 50 MCG/HR TD PT72: 50.0000 ug | MEDICATED_PATCH | TRANSDERMAL | 0 refills | Status: AC

## 2018-01-08 MED ORDER — FLEET ENEMA 7-19 GM/118ML RE ENEM
1.0000 | ENEMA | Freq: Once | RECTAL | Status: AC
  Administered 2018-01-08: 1 via RECTAL
  Filled 2018-01-08: qty 1

## 2018-01-08 MED ORDER — DEXAMETHASONE 1 MG PO TABS: ORAL_TABLET | ORAL | 0 refills | Status: AC

## 2018-01-08 MED ORDER — DIAZEPAM 5 MG PO TABS: 5.0000 mg | ORAL_TABLET | Freq: Four times a day (QID) | ORAL | 0 refills | Status: AC | PRN

## 2018-01-08 MED ORDER — DEXAMETHASONE 1 MG PO TABS: ORAL_TABLET | ORAL | 0 refills | Status: DC

## 2018-01-08 MED ORDER — TAPENTADOL HCL 50 MG PO TABS: 50.0000 mg | ORAL_TABLET | ORAL | 0 refills | Status: AC | PRN

## 2018-01-08 MED ORDER — ONDANSETRON HCL 4 MG PO TABS: 4.0000 mg | ORAL_TABLET | Freq: Four times a day (QID) | ORAL | 0 refills | Status: AC | PRN

## 2018-01-08 NOTE — Progress Notes (Signed)
Occupational Therapy Treatment Patient Details Name: Brian Davenport MRN: [id number] DOB: 1950-11-18 Today's Date: 01/08/2018    History of present illness 67 yo male s/p anterior lateral lumbar fusion 4 levels. PMH includes polycystic kidney disease.   OT comments  Pt completed toilet transfer with (A) required due to descend and LOB with sit<>Stand. Pt educated on fall risk. Pt reports fatigue from ambulation with wife prior to OT arrival.   Follow Up Recommendations  Home health OT;Supervision/Assistance - 24 hour    Equipment Recommendations  3 in 1 bedside commode    Recommendations for Other Services      Precautions / Restrictions Precautions Precautions: Back Required Braces or Orthoses: Spinal Brace Spinal Brace: Lumbar corset       Mobility Bed Mobility Overal bed mobility: Modified Independent             General bed mobility comments: HOB flat and completed sit to supine  Transfers     Transfers: Sit to/from Stand Sit to Stand: Min guard         General transfer comment: able to power up but requires min (A) due to LOB posterior    Balance                                           ADL either performed or assessed with clinical judgement   ADL Overall ADL's : Needs assistance/impaired                       Lower Body Dressing Details (indicate cue type and reason): pt is unable to cross and wife reports they have a "reacher" for home use. pt states "my wife can do i t for me" pt and wife plan to complete LB adls together and does want to continue education for LB Toilet Transfer: Minimal assistance;Regular Teacher, adult education Details (indicate cue type and reason): pt with uncontrolled descend to toile tand upon standing with immediate LOB posteriorly. pt and wife educated on the need to control descend. pt states "i can hold the door frame in front of me" pt educated on bending precaution and how thi sis not the  best option. wife states "he isnt going to fall . we will figure it out" pt and wife educated on the risk of injury due to the speed of descend to the toilet itself.         Functional mobility during ADLs: Min guard General ADL Comments: pt and wife returnign to room from transfer in the hall on the unit and addressing LB and toilet goals     Vision       Perception     Praxis      Cognition Arousal/Alertness: Awake/alert Behavior During Therapy: [facility] for tasks assessed/performed Overall Cognitive Status: Within Functional Limits for tasks assessed                                          Exercises     Shoulder Instructions       General Comments educated on avoiding washing directly on incision site, using clean linen each shower and setting an alarm specifically at night for medications for the next two weeks until follow up    Pertinent Vitals/ Pain  Pain Assessment: Faces Faces Pain Scale: Hurts little more Pain Location:  back Pain Descriptors / Indicators: Grimacing Pain Intervention(s): Monitored during session;Premedicated before session;Repositioned  Home Living                                          Prior Functioning/Environment              Frequency  Min 2X/week        Progress Toward Goals  OT Goals(current goals can now be found in the care plan section)  Progress towards OT goals: Progressing toward goals  Acute Rehab OT Goals Patient Stated Goal: return home, be able to hike and garden  OT Goal Formulation: With patient Time For Goal Achievement: 01/13/18 Potential to Achieve Goals: Good ADL Goals Pt Will Perform Lower Body Bathing: with modified independence;with adaptive equipment;sit to/from stand Pt Will Perform Lower Body Dressing: with modified independence;sit to/from stand;with adaptive equipment(d/c goal - wants to complete with wife ) Pt Will Transfer to Toilet: ambulating;with  supervision Pt Will Perform Toileting - Clothing Manipulation and hygiene: with min guard assist;with adaptive equipment;sit to/from stand Pt Will Perform Tub/Shower Transfer: with supervision;ambulating;shower seat  Plan Discharge plan remains appropriate    Co-evaluation                 AM-PAC PT "6 Clicks" Daily Activity     Outcome Measure   Help from another person eating meals?: None Help from another person taking care of personal grooming?: None Help from another person toileting, which includes using toliet, bedpan, or urinal?: A Little Help from another person bathing (including washing, rinsing, drying)?: A Little Help from another person to put on and taking off regular upper body clothing?: A Little Help from another person to put on and taking off regular lower body clothing?: A Little 6 Click Score: 20    End of Session Equipment Utilized During Treatment: Back brace  OT Visit Diagnosis: Unsteadiness on feet (R26.81)   Activity Tolerance Patient tolerated treatment well   Patient Left in bed;with call bell/phone within reach;with family/visitor present;with SCD's reapplied   Nurse Communication Mobility status;Precautions        Time: 1401-1410 OT Time Calculation (min): 9 min  Charges: OT General Charges $OT Visit: 1 Visit OT Treatments $Self Care/Home Management : 8-22 mins   Mateo FlowJones, Brynn   OTR/L Pager: [phone] Office: [phone] .    Boone MasterJones, Eliasar Hlavaty B 01/08/2018, 2:47 PM

## 2018-01-08 NOTE — Progress Notes (Signed)
Physical Therapy Treatment Patient Details Name: Brian Davenport MRN: [id number] DOB: 03-24-1951 Today's Date: 01/08/2018    History of Present Illness 67 yo male s/p anterior lateral lumbar fusion 4 levels. PMH includes polycystic kidney disease.    PT Comments    Patient progressing with ambulation no device this session and able to negotiate steps to simulate home entry.  Feel he likely would benefit from using his cane due to still with staggering steps during ambulation.  Will follow up if not d/c.    Follow Up Recommendations  No PT follow up     Equipment Recommendations  None recommended by PT    Recommendations for Other Services       Precautions / Restrictions Precautions Precautions: Back Precaution Comments: able to verbalize BLT precautions Required Braces or Orthoses: Spinal Brace Spinal Brace: Lumbar corset;Applied in sitting position    Mobility  Bed Mobility Overal bed mobility: Modified Independent Bed Mobility: Rolling;Sidelying to Sit Rolling: Modified independent (Device/Increase time) Sidelying to sit: Modified independent (Device/Increase time)       General bed mobility comments: performed appropriately without cues  Transfers   Equipment used: None Transfers: Sit to/from Stand Sit to Stand: Min guard         General transfer comment: assist to steady  Ambulation/Gait Ambulation/Gait assistance: Min guard Gait Distance (Feet): 250 Feet Assistive device: None Gait Pattern/deviations: Step-through pattern;Decreased stride length     General Gait Details: walked without walker as pt reports had not been using it to walk, occasional staggering with minguard for safety   Stairs Stairs: Yes Stairs assistance: Min assist Stair Management: Step to pattern;Alternating pattern;Forwards(HHA) Number of Stairs: 3 General stair comments: cues for step to sequence, HHA as no rails at home, does have cane   Wheelchair Mobility     Modified Rankin (Stroke Patients Only)       Balance Overall balance assessment: Needs assistance   Sitting balance-Leahy Scale: Good     Standing balance support: No upper extremity supported Standing balance-Leahy Scale: Fair Standing balance comment: able to walk no AD, but would have benefitted from cane                            Cognition Arousal/Alertness: Awake/alert Behavior During Therapy: WFL for tasks assessed/performed Overall Cognitive Status: Within Functional Limits for tasks assessed                                        Exercises      General Comments General comments (skin integrity, edema, etc.): states no BM in 5 days and holding up discharge, reports plans for enema      Pertinent Vitals/Pain Pain Assessment: Faces Pain Score: 6  Faces Pain Scale: Hurts little more Pain Location:  back Pain Descriptors / Indicators: Aching Pain Intervention(s): Monitored during session;Repositioned    Home Living                      Prior Function            PT Goals (current goals can now be found in the care plan section) Acute Rehab PT Goals Patient Stated Goal: return home, be able to hike and garden  Progress towards PT goals: Progressing toward goals    Frequency    Min 5X/week  PT Plan Current plan remains appropriate    Co-evaluation              AM-PAC PT "6 Clicks" Daily Activity  Outcome Measure  Difficulty turning over in bed (including adjusting bedclothes, sheets and blankets)?: A Little Difficulty moving from lying on back to sitting on the side of the bed? : A Little Difficulty sitting down on and standing up from a chair with arms (e.g., wheelchair, bedside commode, etc,.)?: A Little Help needed moving to and from a bed to chair (including a wheelchair)?: A Little Help needed walking in hospital room?: A Little Help needed climbing 3-5 steps with a railing? : A Little 6 Click  Score: 18    End of Session Equipment Utilized During Treatment: Back brace Activity Tolerance: Patient tolerated treatment well Patient left: with call bell/phone within reach;in chair   PT Visit Diagnosis: Unsteadiness on feet (R26.81);Muscle weakness (generalized) (M62.81);Other abnormalities of gait and mobility (R26.89)     Time: 8119-14781605-1624 PT Time Calculation (min) (ACUTE ONLY): 19 min  Charges:  $Gait Training: 8-22 mins                     [place]yndi Wynn, [place] CarolinaPT [phone] 01/08/2018    Elray Mcgregorynthia Wynn 01/08/2018, 4:36 PM

## 2018-01-08 NOTE — Progress Notes (Signed)
Patient ID: [name], male   DOB: Dec 27, 1950, 67 y.o.   MRN: [id number] Vital signs are stable Patient is doing much better pain wise We will see how he does this afternoon If bowels moving the patient tremulousness subsides will consider discharge home

## 2018-01-08 NOTE — Care Management Important Message (Signed)
Important Message  Patient Details  Name: Brian Davenport MRN: [id number] Date of Birth: 1950/11/15   Medicare Important Message Given:  Yes    [name] 01/08/2018, 3:55 PM

## 2018-01-09 NOTE — Discharge Summary (Signed)
Physician Discharge Summary  Patient ID: Brian Davenport MRN: [id number] DOB/AGE: [birth year] 67 y.o.  Admit date: 01/05/2018 Discharge date: 01/09/2018  Admission Diagnoses: Degenerative scoliosis.  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 Active Problems:   Lumbar spine scoliosis   Discharged Condition: good  Hospital Course: Patient was admitted to undergo surgical decompression and stabilization from L1-L5.  He tolerated surgery well.  Motor function has been good.  He did have constipation during the early postoperative phase.  This has resolved itself at the time of discharge.  Consults: None  Significant Diagnostic Studies: None  Treatments: surgery: Anterolateral decompression L1-L2 3 L3-4 L4-5 posterior segmental fixation L1-L5 with robotically assisted screw placement, neuro monitoring.  Discharge Exam: Blood pressure 132/75, pulse 73, temperature 97.9 F (36.6 C), temperature source Oral, resp. rate 16, weight 75.8 kg, SpO2 92 %. Incision is clean and dry motor function is intact.  Disposition: Discharge disposition: 01-Home or Self Care       Discharge Instructions    Call MD for:  redness, tenderness, or signs of infection (pain, swelling, redness, odor or green/yellow discharge around incision site)   Complete by:  As directed    Call MD for:  severe uncontrolled pain   Complete by:  As directed    Call MD for:  temperature >100.4   Complete by:  As directed    Diet - low sodium heart healthy   Complete by:  As directed    Discharge instructions   Complete by:  As directed    Okay to shower. Do not apply salves or appointments to incision. No heavy lifting with the upper extremities greater than 15 pounds. May resume driving when not requiring pain medication and patient feels comfortable with doing so.   Incentive spirometry RT   Complete by:  As directed    Increase activity slowly   Complete by:  As directed      Allergies as of 01/09/2018      Reactions   Melatonin Hypertension   Demerol [meperidine Hcl] Nausea And Vomiting   Morphine And Related Nausea And Vomiting   Codeine Nausea And Vomiting   "Any opioids "      Medication List    STOP taking these medications   MAG-200 200 MG Tabs Generic drug:  Magnesium Oxide   vitamin C 1000 MG tablet   Vitamin D 2000 units tablet     TAKE these medications   dexamethasone 1 MG tablet Commonly known as:  DECADRON 2 tablets twice daily for 2 days, one tablet twice daily for 2 days, one tablet daily for 2 days.   diazepam 5 MG tablet Commonly known as:  VALIUM Take 1 tablet (5 mg total) by mouth every 6 (six) hours as needed for muscle spasms.   fentaNYL 50 MCG/HR Commonly known as:  DURAGESIC - dosed mcg/hr Place 1 patch (50 mcg total) onto the skin every 3 (three) days. Start taking on:  01/10/2018   ondansetron 4 MG tablet Commonly known as:  ZOFRAN Take 1 tablet (4 mg total) by mouth every 6 (six) hours as needed for nausea or vomiting.   tapentadol 50 MG tablet Commonly known as:  NUCYNTA Take 1 tablet (50 mg total) by mouth every 4 (four) hours as needed for severe pain (Breakthrough pain).        SignedStefani Dama: Bodi Palmeri J 01/09/2018, 8:32 AM

## 2018-01-09 NOTE — Progress Notes (Signed)
Patient d/c instructions given, questions answered.  Prescriptions given to wife.  No equipment to give.  IV removed.  Patient taken to car via wheelchair.

## 2019-06-14 IMAGING — CT CT L SPINE W/O CM
3 of 6 series · 10 of 33 positions shown, 11 images · non-contrast
Comparison: Radiography 10/08/2017.  MRI 09/08/2017.

CLINICAL DATA: Degenerative lumbar spinal stenosis. Low back pain.
Maze or protocol.

EXAM:
CT LUMBAR SPINE WITHOUT CONTRAST
TECHNIQUE: Multidetector CT imaging of the lumbar spine was performed without
intravenous contrast administration. Multiplanar CT image
reconstructions were also generated.

[Series 8: cor bone · coronal · 0.34mm/px · 1 of 81 slices shown]
[im 41/81  bone]
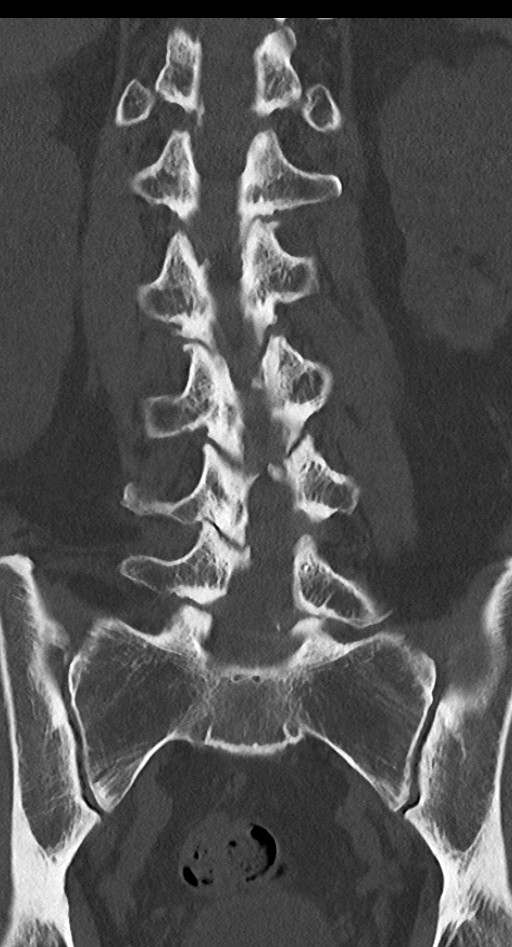

[Series 10: sag st · sagittal · 0.27mm/px · 5 of 88 slices shown]
[im 15/88  bone]
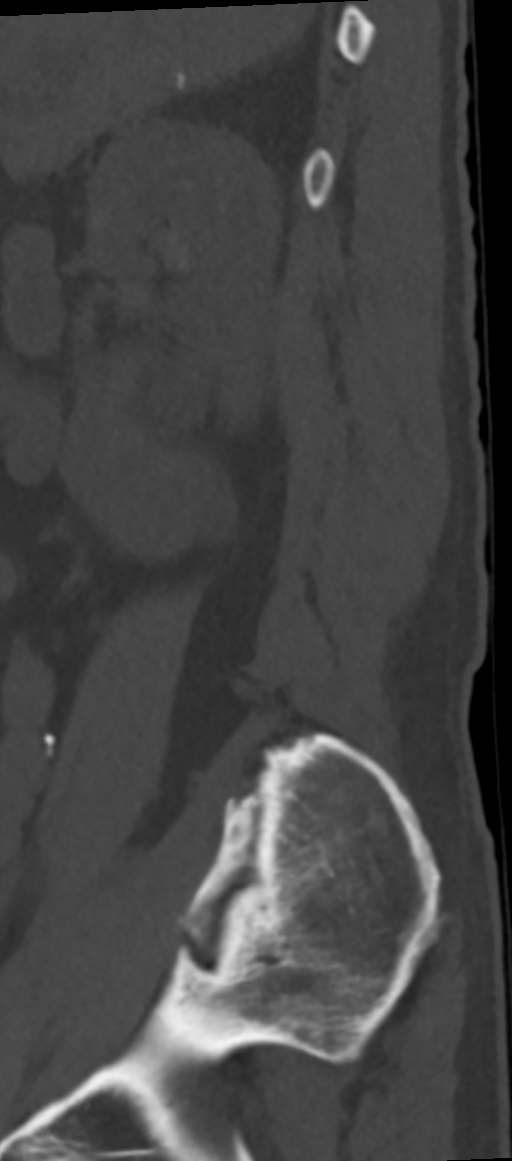
[im 30/88  bone]
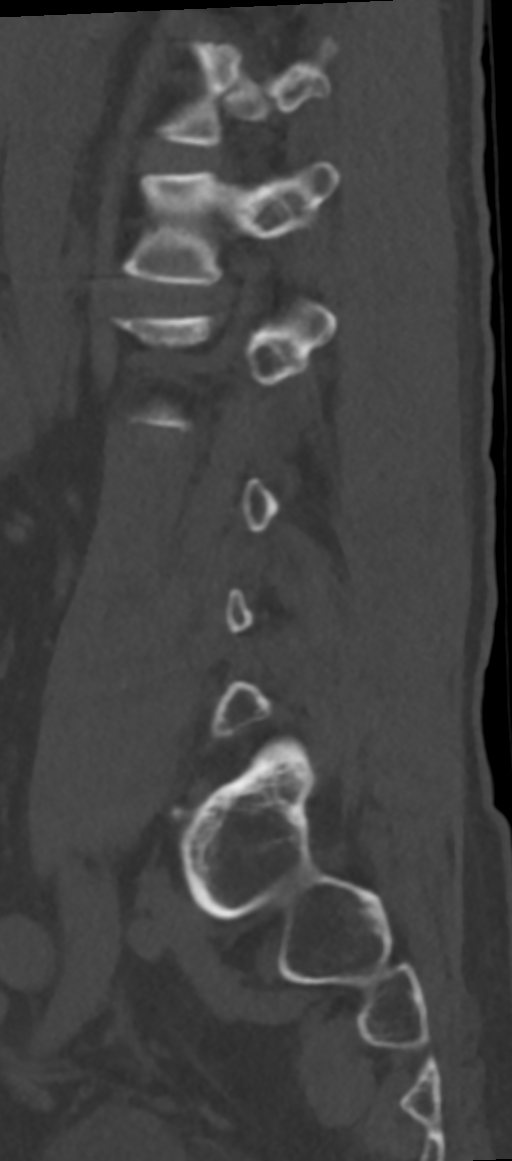
[im 44/88  bone]
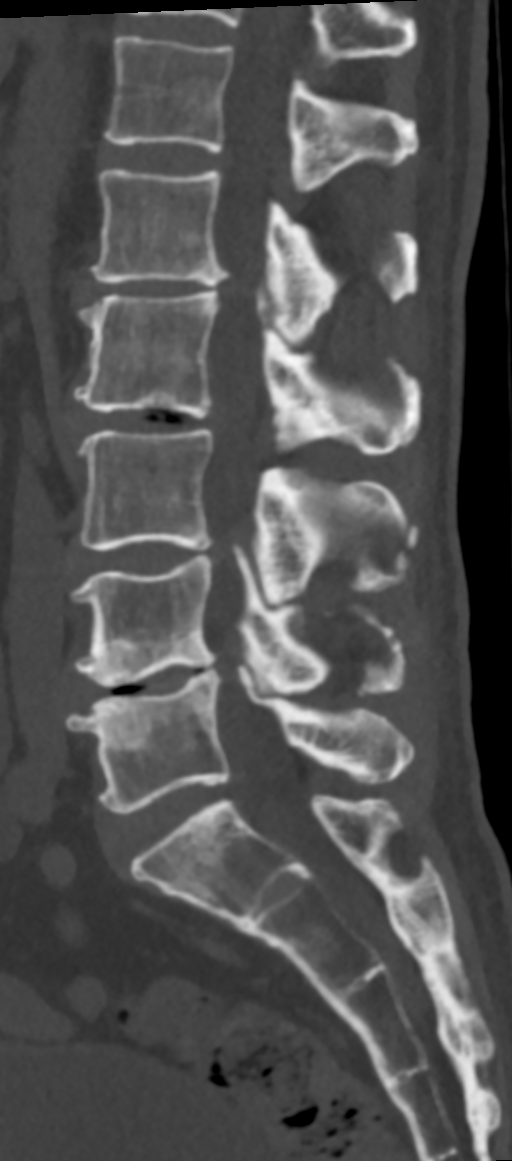
[im 59/88  bone]
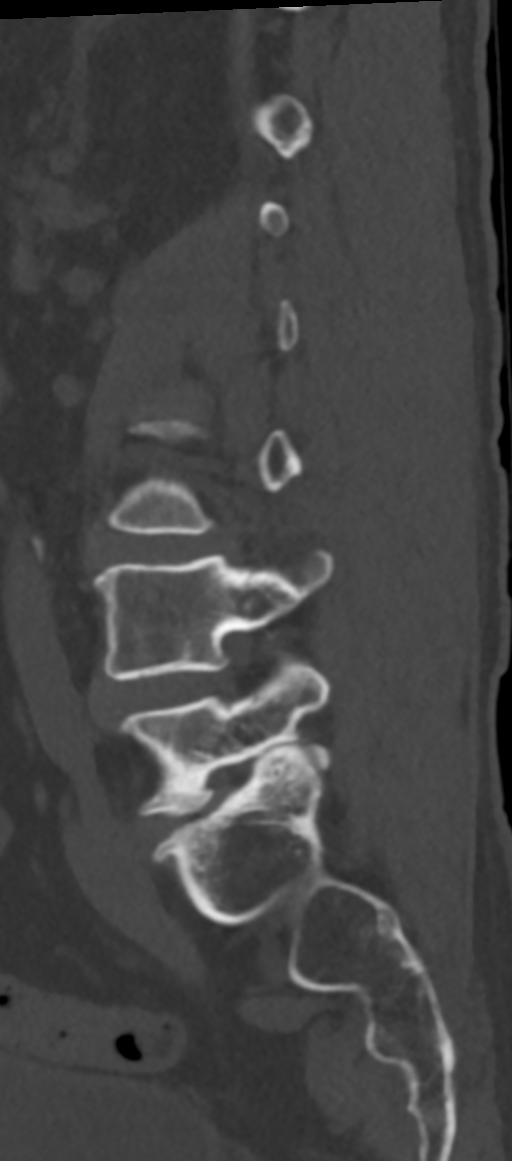
[im 73/88  bone]
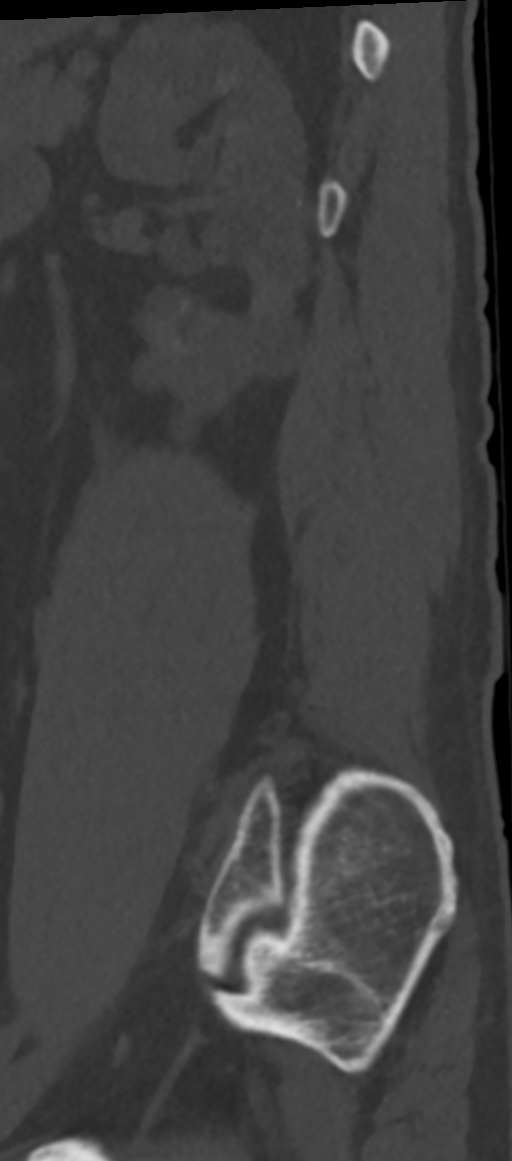

[Series 12: l spine soft thins · axial · 0.30mm/px · z∈[+1110,+1297]mm · 4 of 521 slices shown, 5 images]
[im 105/521  soft-tissue]
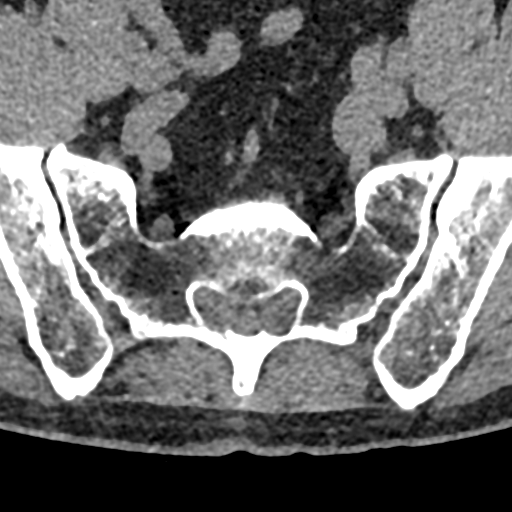
[im 105/521  bone]
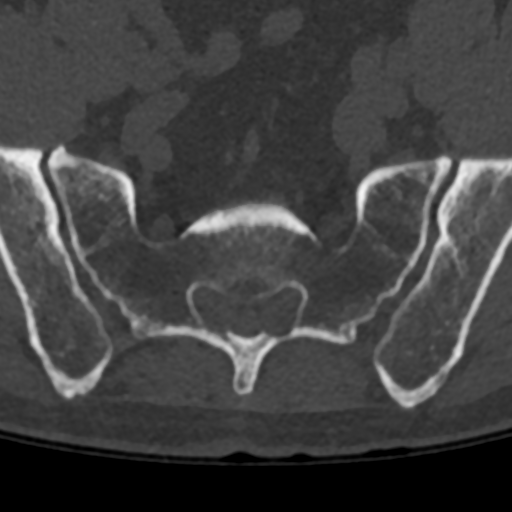
[im 209/521  bone]
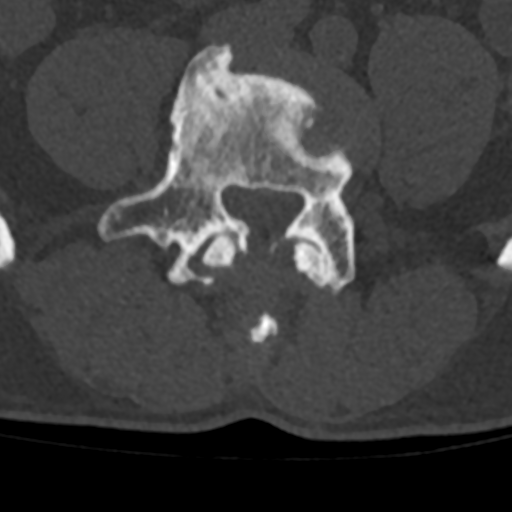
[im 313/521  bone]
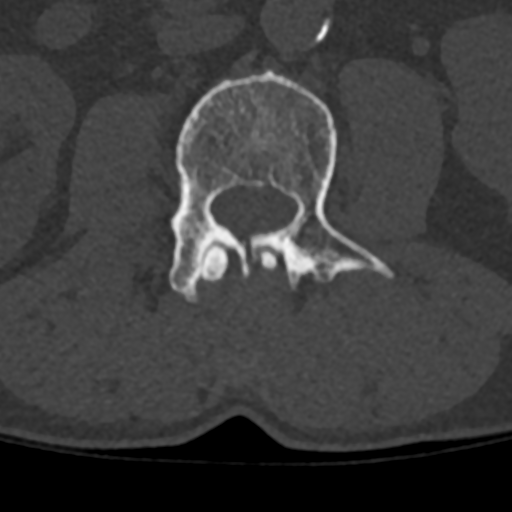
[im 417/521  bone]
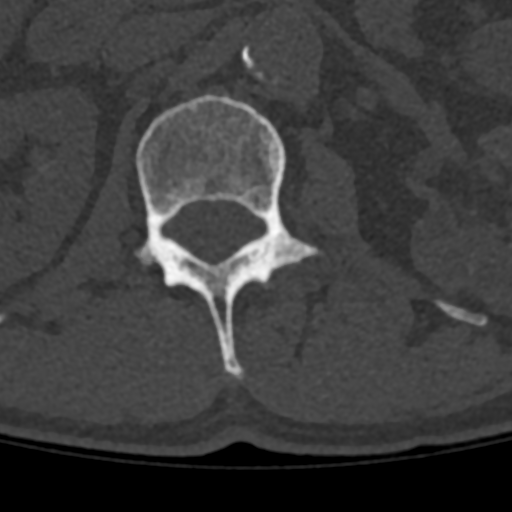

[10 of 33 positions shown; findings below may reference images not displayed]

FINDINGS: Segmentation: 5 lumbar type vertebral bodies.

Alignment: Thoracolumbar curvature convex to the right and lower
lumbar curvature convex to the left.

Vertebrae: No fracture or primary bone lesion. Discogenic endplate
changes, most pronounced on the left at L1-2 and on the right at
L4-5.

Paraspinal and other soft tissues: Abnormal kidneys with multiple
calcifications and lobular foci that are not primarily or completely
evaluated. This is presumed to represent multi-cystic kidney
disease, but this is not conclusive.

Disc levels: T11-12 and T12-L1: Unremarkable.

L1-2: Disc degeneration more pronounced on the left. Endplate
osteophytes and bulging of the disc. Mild narrowing of the left
lateral recess and intervertebral foramen on the left without
distinct neural compression. Facet degeneration worse on the left.

L2-3: Endplate osteophytes and circumferential bulging of the disc.
Facet degeneration and hypertrophy worse on the right.
Multifactorial stenosis at this level that could cause neural
compression on either or both sides.

L3-4: Endplate osteophytes and circumferential bulging of the disc.
Facet and ligamentous hypertrophy. Multifactorial stenosis at this
level that could cause neural compression on either or both sides.

L4-5: Disc degeneration more pronounced on the right. Endplate
osteophytes and bulging of the disc. Facet and ligamentous
hypertrophy. Multifactorial spinal stenosis more severe on the right
which could cause neural compression on either or both sides,
particularly the right. Foraminal stenosis on the right likely to be
significant.

L5-S1: Endplate osteophytes and bulging of the disc. Mild facet
hypertrophy. No compressive central canal stenosis. Left foraminal
stenosis and subarticular lateral recess stenosis that could cause
neural compression.

Sacroiliac joints show ordinary osteoarthritis.
IMPRESSION: Spinal curvature and degenerative changes as above. Potentially
significant stenosis on the left at L1-2, generalized at L2-3, L3-4
and L4-5, and worse on the left at L5-S1. See above for details at
each level.
